# Patient Record
Sex: Female | Born: 1955 | Race: Black or African American | Hispanic: No | Marital: Married | Smoking: Never smoker
Health system: Southern US, Community
[De-identification: ages and names within clinical notes are randomized; demographics above are authoritative.]

## PROBLEM LIST (undated history)

## (undated) ENCOUNTER — Ambulatory Visit: Payer: Medicare HMO

## (undated) DIAGNOSIS — M1990 Unspecified osteoarthritis, unspecified site: Secondary | ICD-10-CM

## (undated) DIAGNOSIS — I499 Cardiac arrhythmia, unspecified: Secondary | ICD-10-CM

## (undated) DIAGNOSIS — I10 Essential (primary) hypertension: Secondary | ICD-10-CM

## (undated) DIAGNOSIS — H409 Unspecified glaucoma: Secondary | ICD-10-CM

## (undated) DIAGNOSIS — E119 Type 2 diabetes mellitus without complications: Secondary | ICD-10-CM

## (undated) HISTORY — PX: TONSILLECTOMY: SUR1361

## (undated) HISTORY — PX: ABDOMINAL HYSTERECTOMY: SHX81

## (undated) HISTORY — DX: Cardiac arrhythmia, unspecified: I49.9

---

## 2005-12-14 ENCOUNTER — Inpatient Hospital Stay: Payer: Self-pay | Admitting: Obstetrics and Gynecology

## 2012-05-08 DIAGNOSIS — I10 Essential (primary) hypertension: Secondary | ICD-10-CM | POA: Insufficient documentation

## 2012-05-08 DIAGNOSIS — E1159 Type 2 diabetes mellitus with other circulatory complications: Secondary | ICD-10-CM | POA: Insufficient documentation

## 2012-05-08 DIAGNOSIS — H409 Unspecified glaucoma: Secondary | ICD-10-CM | POA: Insufficient documentation

## 2012-05-08 DIAGNOSIS — E119 Type 2 diabetes mellitus without complications: Secondary | ICD-10-CM | POA: Insufficient documentation

## 2013-04-09 ENCOUNTER — Ambulatory Visit: Payer: Self-pay | Admitting: Family Medicine

## 2015-05-03 DIAGNOSIS — R809 Proteinuria, unspecified: Secondary | ICD-10-CM | POA: Insufficient documentation

## 2015-05-03 DIAGNOSIS — Z6835 Body mass index (BMI) 35.0-35.9, adult: Secondary | ICD-10-CM | POA: Insufficient documentation

## 2015-05-03 DIAGNOSIS — M1990 Unspecified osteoarthritis, unspecified site: Secondary | ICD-10-CM | POA: Insufficient documentation

## 2015-05-03 DIAGNOSIS — E669 Obesity, unspecified: Secondary | ICD-10-CM | POA: Insufficient documentation

## 2015-11-02 ENCOUNTER — Other Ambulatory Visit: Payer: Self-pay | Admitting: Family Medicine

## 2015-12-16 ENCOUNTER — Emergency Department: Payer: Self-pay

## 2015-12-16 ENCOUNTER — Encounter: Payer: Self-pay | Admitting: Emergency Medicine

## 2015-12-16 ENCOUNTER — Emergency Department
Admission: EM | Admit: 2015-12-16 | Discharge: 2015-12-16 | Disposition: A | Discharge status: Home / Self Care | Payer: Self-pay | Attending: Emergency Medicine | Admitting: Emergency Medicine

## 2015-12-16 DIAGNOSIS — W01198A Fall on same level from slipping, tripping and stumbling with subsequent striking against other object, initial encounter: Secondary | ICD-10-CM | POA: Insufficient documentation

## 2015-12-16 DIAGNOSIS — Y92480 Sidewalk as the place of occurrence of the external cause: Secondary | ICD-10-CM | POA: Insufficient documentation

## 2015-12-16 DIAGNOSIS — W19XXXA Unspecified fall, initial encounter: Secondary | ICD-10-CM

## 2015-12-16 DIAGNOSIS — S60416A Abrasion of right little finger, initial encounter: Secondary | ICD-10-CM | POA: Insufficient documentation

## 2015-12-16 DIAGNOSIS — R0789 Other chest pain: Secondary | ICD-10-CM | POA: Insufficient documentation

## 2015-12-16 DIAGNOSIS — E119 Type 2 diabetes mellitus without complications: Secondary | ICD-10-CM | POA: Insufficient documentation

## 2015-12-16 DIAGNOSIS — I10 Essential (primary) hypertension: Secondary | ICD-10-CM | POA: Insufficient documentation

## 2015-12-16 DIAGNOSIS — S0003XA Contusion of scalp, initial encounter: Secondary | ICD-10-CM

## 2015-12-16 DIAGNOSIS — Y9301 Activity, walking, marching and hiking: Secondary | ICD-10-CM | POA: Insufficient documentation

## 2015-12-16 DIAGNOSIS — Y999 Unspecified external cause status: Secondary | ICD-10-CM | POA: Insufficient documentation

## 2015-12-16 HISTORY — DX: Essential (primary) hypertension: I10

## 2015-12-16 HISTORY — DX: Type 2 diabetes mellitus without complications: E11.9

## 2015-12-16 MED ORDER — TRAMADOL HCL 50 MG PO TABS: 50.0000 mg | ORAL_TABLET | Freq: Four times a day (QID) | ORAL | 0 refills | Status: AC | PRN

## 2015-12-16 MED ORDER — BACITRACIN ZINC 500 UNIT/GM EX OINT
TOPICAL_OINTMENT | CUTANEOUS | Status: AC
  Filled 2015-12-16: qty 0.9

## 2015-12-16 MED ORDER — TRAMADOL HCL 50 MG PO TABS
50.0000 mg | ORAL_TABLET | Freq: Once | ORAL | Status: AC
  Administered 2015-12-16: 50 mg via ORAL
  Filled 2015-12-16: qty 1

## 2015-12-16 MED ORDER — DOUBLE ANTIBIOTIC 500-10000 UNIT/GM EX OINT
TOPICAL_OINTMENT | Freq: Once | CUTANEOUS | Status: AC
  Administered 2015-12-16: 23:00:00 via TOPICAL

## 2015-12-16 NOTE — ED Notes (Signed)
Pt presents to ED 17 after a slip and a fall at about 1830 this evening. Pt denies loss of consciousness, any loss of mental capability, loss or blurry vision, or nausea and vomiting. Pt does have a golf ball size hematoma above the right eye. Pt complains of pain to that area as well as to the ribs on the right side and the pinky finger on the right hand.

## 2015-12-16 NOTE — ED Notes (Signed)
Pt is in good condition, discharge instructions reviewed, follow up care and home care reviewed; prescription medication reviewed; pt verbalized understanding. Pt is ambulatory, but request a wheelchair to leave.  Pt is leaving with husband.

## 2015-12-16 NOTE — ED Notes (Signed)
Per MD McShane, pt does not require blood work or CT at this time. Pt given ice bag for head at this time. Pt explained risks of radiation and is understanding of MD's decision.

## 2015-12-16 NOTE — ED Notes (Signed)
Wrapped pt finger with bacitracin and guaze. Pt tolerated it well. Provided pt education regarding home care.

## 2015-12-16 NOTE — ED Provider Notes (Signed)
[facility] Emergency Department Provider Note   ____________________________________________   I have reviewed the triage vital signs and the nursing notes.   HISTORY  Chief Complaint Fall   History limited by: Not Limited   HPI Leah Sawyer is a 60 y.o. female who presents to the emergency department today after a fall. The patient states she was walking when she stepped off of a sidewalk onto a little dry. She did fall down onto her right side. The patient hit the right side of her forehead, right hand and right side of her chest. Primary pain is around her right forehead. She denies any change in vision double vision or pain with movement of her eye. She is not had any nausea or vomiting. She did not have any loss of consciousness. Eyes any shortness of breath. The patient denies any bloody cough. The patient denies any cervical spine.   Past Medical History:  Diagnosis Date  . Diabetes mellitus without complication (HCC)   . Hypertension     There are no active problems to display for this patient.   Past Surgical History:  Procedure Laterality Date  . ABDOMINAL HYSTERECTOMY    . TONSILLECTOMY      Prior to Admission medications   Not on File    Allergies Patient has no known allergies.  No family history on file.  Social History Social History  Substance Use Topics  . Smoking status: Never Smoker  . Smokeless tobacco: Never Used  . Alcohol use No    Review of Systems  Constitutional: Negative for fever. Cardiovascular: Positive for right lower chest pain. Respiratory: Negative for shortness of breath. Gastrointestinal: Negative for abdominal pain, vomiting and diarrhea. Musculoskeletal: Positive for left arm pain Neurological: Positive for headache.   10-point ROS otherwise negative.  ____________________________________________   PHYSICAL EXAM:  VITAL SIGNS: ED Triage Vitals  Enc Vitals Group     BP 12/16/15  1917 (!) 158/79     Pulse Rate 12/16/15 1917 93     Resp --      Temp 12/16/15 1917 98.6 F (37 C)     Temp Source 12/16/15 1917 Oral     SpO2 12/16/15 1917 98 %     Weight 12/16/15 1918 200 lb (90.7 kg)     Height 12/16/15 1918 5\' 2"  (1.575 m)     Head Circumference --      Peak Flow --      Pain Score 12/16/15 1918 7   Constitutional: Alert and oriented. Well appearing and in no distress. Eyes: Conjunctivae are normal. Normal extraocular movements. ENT   Head: Normocephalic. Hematoma with small overlying abrasion noted to right forehead. EOMI. No hemotympanum. No battle sign. No racoon eyes.   Nose: No congestion/rhinnorhea.   Mouth/Throat: Mucous membranes are moist.   Neck: No stridor. No midline tenderness. Hematological/Lymphatic/Immunilogical: No cervical lymphadenopathy. Cardiovascular: Normal rate, regular rhythm.  No murmurs, rubs, or gallops.  Respiratory: Normal respiratory effort without tachypnea nor retractions. Breath sounds are clear and equal bilaterally. No wheezes/rales/rhonchi. Gastrointestinal: Soft and nontender. No distention.  Genitourinary: Deferred Musculoskeletal: Normal range of motion in all extremities. Small abrasions to lateral side of right fifth digit, some tenderness to right fifth metacarpal. No spinal tenderness. Mild tenderness to palpation of right lower chest.  Neurologic:  Normal speech and language. No gross focal neurologic deficits are appreciated.  Skin:  Skin is warm, dry and intact. No rash noted. Psychiatric: Mood and affect are normal. Speech and  behavior are normal. Patient exhibits appropriate insight and judgment.  ____________________________________________    LABS (pertinent positives/negatives)  None  ____________________________________________   EKG  None  ____________________________________________    RADIOLOGY  Right  hand  IMPRESSION: Negative. ____________________________________________   PROCEDURES  Procedures  ____________________________________________   INITIAL IMPRESSION / ASSESSMENT AND PLAN / ED COURSE  Pertinent labs & imaging results that were available during my care of the patient were reviewed by me and considered in my medical decision making (see chart for details).  Patient presents to the emergency department today after mechanical fall primarily complaining of head ache, right hand pain and right chest wall pain. Patient did not have any loss of consciousness, no nausea or vomiting. Is alert and oriented. At this point do not think emergent neuro imaging is necessary given lack of concerning symptoms or signs. Will proceed with x-ray of the right hand given tenderness over the right fifth metacarpal. Additionally the patient had some mild tenderness to palpation of the right chest wall however was not significant and the patient did not have any shortness of breath. Do not think chest x-ray is warranted at this time.  Clinical Course    The patient's x-ray of the right hand was negative. Will discharge home. ____________________________________________   FINAL CLINICAL IMPRESSION(S) / ED DIAGNOSES  Final diagnoses:  Fall, initial encounter  Contusion of scalp, initial encounter     Note: This dictation was prepared with Nurse, children'sDragon dictation. Any transcriptional errors that result from this process are unintentional    Phineas SemenGraydon Azaleah Usman, MD 12/16/15 2255

## 2015-12-16 NOTE — ED Triage Notes (Signed)
Pt states that they were on [place] way [place] the [place] this [place] when pt slipped off of the sidewalk and hit her head and right hand. Pt denies LOC and is alert and oriented at his time on triage.

## 2015-12-16 NOTE — Discharge Instructions (Signed)
Please seek medical attention for any high fevers, chest pain, shortness of breath, change in behavior, persistent vomiting, bloody stool or any other new or concerning symptoms.  

## 2016-11-28 ENCOUNTER — Ambulatory Visit: Payer: Self-pay

## 2016-12-12 ENCOUNTER — Ambulatory Visit
Admission: RE | Admit: 2016-12-12 | Discharge: 2016-12-12 | Disposition: A | Discharge status: Home / Self Care | Payer: Self-pay | Source: Ambulatory Visit | Attending: Oncology | Admitting: Oncology

## 2016-12-12 ENCOUNTER — Ambulatory Visit: Payer: Self-pay | Attending: Oncology

## 2016-12-12 ENCOUNTER — Encounter (INDEPENDENT_AMBULATORY_CARE_PROVIDER_SITE_OTHER): Payer: Self-pay

## 2016-12-12 VITALS — BP 129/86 | HR 84 | Temp 97.3°F | Resp 18 | Ht 64.0 in | Wt 204.0 lb

## 2016-12-12 DIAGNOSIS — Z0000 Encounter for general adult medical examination without abnormal findings: Secondary | ICD-10-CM

## 2016-12-12 NOTE — Progress Notes (Addendum)
Subjective:     Patient ID: Leah FiedlerBonnie J Sawyer, female   DOB: 03/10/1955, 61 y.o.   MRN: [id number]  HPI   Review of Systems     Objective:   Physical Exam  Pulmonary/Chest: Right breast exhibits no inverted nipple, no mass, no nipple discharge, no skin change and no tenderness. Left breast exhibits no inverted nipple, no mass, no nipple discharge, no skin change and no tenderness. Breasts are symmetrical.    Cyst removal scar       Assessment:     61 year old patient presents for [facility] clinic visit.  Patient screened, and meets B[facility]CP eligibility.  Patient does not have insurance, Medicare or Medicaid.  Handout given on Affordable Care Act.  Instructed patient on breast self-exam using teach back method.  CBE unremarkable.  No mass or lump palpated. Pelvic exam normal.  History of hysterectomy for fibroids.   Patient taught piano lessons.    Plan:     Sent for bilateral screening mammogram.

## 2016-12-19 NOTE — Progress Notes (Signed)
Letter mailed from [facility] to notify of normal mammogram results.  Patient to return in one year for annual screening.  Copy to [facility]. 

## 2018-12-29 ENCOUNTER — Telehealth: Payer: Self-pay

## 2018-12-29 ENCOUNTER — Other Ambulatory Visit: Payer: Self-pay

## 2018-12-29 DIAGNOSIS — Z1211 Encounter for screening for malignant neoplasm of colon: Secondary | ICD-10-CM

## 2018-12-29 NOTE — Telephone Encounter (Signed)
Gastroenterology Pre-Procedure Review  Request Date: Thursday January 7th,2021 Requesting Physician: Dr. Allen Norris  PATIENT REVIEW QUESTIONS: The patient responded to the following health history questions as indicated:    1. Are you having any GI issues? no 2. Do you have a personal history of Polyps? yes (5 years ago) 3. Do you have a family history of Colon Cancer or Polyps? no 4. Diabetes Mellitus?yes oral meds 5. Joint replacements in the past 12 months?no 6. Major health problems in the past 3 months?no 7. Any artificial heart valves, MVP, or defibrillator?no    MEDICATIONS & ALLERGIES:    Patient reports the following regarding taking any anticoagulation/antiplatelet therapy:   Plavix, Coumadin, Eliquis, Xarelto, Lovenox, Pradaxa, Brilinta, or Effient? no Aspirin? yes (81mg  daily)  Patient confirms/reports the following medications:  No current outpatient medications on file.   No current facility-administered medications for this visit.    Patient confirms/reports the following allergies:  No Known Allergies  No orders of the defined types were placed in this encounter.   AUTHORIZATION INFORMATION Primary Insurance: 1D#: Group #:  Secondary Insurance: 1D#: Group #:  SCHEDULE INFORMATION: Date: Thursday 01/22/19 Time: Location:[facility]

## 2019-01-19 ENCOUNTER — Telehealth: Payer: Self-pay

## 2019-01-19 NOTE — Telephone Encounter (Signed)
Returned patients call to let her know the message was received in regard to canceling her colonoscopy with Dr. Servando Snare on 01/22/19.  She does not want to reschedule at this time.  Vikki in endo has been notified.  Referral noted.  Thanks [employer]

## 2019-01-20 ENCOUNTER — Other Ambulatory Visit: Payer: Self-pay

## 2019-01-22 ENCOUNTER — Ambulatory Visit: Admission: RE | Admit: 2019-01-22 | Payer: Self-pay | Source: Home / Self Care | Admitting: Gastroenterology

## 2019-01-22 ENCOUNTER — Encounter: Admission: RE | Payer: Self-pay | Source: Home / Self Care

## 2019-01-22 SURGERY — COLONOSCOPY WITH PROPOFOL
Anesthesia: General

## 2020-03-15 ENCOUNTER — Telehealth: Payer: Self-pay

## 2020-03-15 NOTE — Telephone Encounter (Signed)
Copied from CRM [id number]. Topic: Medical Record Request - Other >> Mar 15, 2020  3:17 PM Pawlus, Maxine Glenn A wrote: Patient Name/DOB/MRN #: Leah Sawyer / 1955/11/05 / [id number] Call Back #: [phone] Information Requested: Pt used to come to [facility] and wanted her entire medical record from when she was a patient. Does not have MyChart.

## 2020-05-24 ENCOUNTER — Ambulatory Visit
Admission: RE | Admit: 2020-05-24 | Discharge: 2020-05-24 | Disposition: A | Discharge status: Home / Self Care | Payer: Self-pay | Source: Ambulatory Visit | Attending: Oncology | Admitting: Oncology

## 2020-05-24 ENCOUNTER — Encounter: Payer: Self-pay | Admitting: *Deleted

## 2020-05-24 ENCOUNTER — Ambulatory Visit: Payer: Self-pay | Attending: Oncology | Admitting: *Deleted

## 2020-05-24 ENCOUNTER — Other Ambulatory Visit: Payer: Self-pay

## 2020-05-24 VITALS — BP 122/75 | HR 92 | Temp 97.5°F | Ht 64.0 in | Wt 196.7 lb

## 2020-05-24 DIAGNOSIS — Z0000 Encounter for general adult medical examination without abnormal findings: Secondary | ICD-10-CM

## 2020-05-24 NOTE — Progress Notes (Signed)
Letter mailed from [facility] to inform patient of her normal mammogram results.  Patient is to follow-up with annual screening in one year. 

## 2020-05-24 NOTE — Progress Notes (Signed)
  Subjective:     Patient ID: Leah Sawyer, female   DOB: 03-18-1955, 65 y.o.   MRN: [id number]  HPI   [facility] Medical History Record - 05/24/20 1103      Breast History   Screening cycle New    Provider (C[facility]) [facility]    Initial Mammogram 05/24/20    Last Mammogram Annual    Last Mammogram Date 12/12/16    Provider (Mammogram)  Delford Field    Recent Breast Symptoms None      Breast Cancer History   Breast Cancer History No personal or family history      Previous History of Breast Problems   Breast Surgery or Biopsy None    Breast Implants N/A    BSE Done Monthly      Gynecological/Obstetrical History   LMP --   hysterectomy 2007   Is there any chance that the client could be pregnant?  No    Age at menarche 48    Age at menopause 2007    Age at first live birth 34    Breast fed children Yes (type length in comments)   6 months   DES Exposure No    Cervical, Uterine or Ovarian cancer No    Family history of Cervial, Uterine or Ovarian cancer No    Hysterectomy Yes    Cervix removed Yes    Ovaries removed Yes    Laser/Cryosurgery No    Current method of birth control None    Current method of Estrogen/Hormone replacement None    Smoking history None            Review of Systems     Objective:   Physical Exam Chest:  Breasts:     Right: No swelling, bleeding, inverted nipple, mass, nipple discharge, skin change, tenderness, axillary adenopathy or supraclavicular adenopathy.     Left: No swelling, bleeding, inverted nipple, mass, nipple discharge, skin change, tenderness, axillary adenopathy or supraclavicular adenopathy.     Lymphadenopathy:     Upper Body:     Right upper body: No supraclavicular or axillary adenopathy.     Left upper body: No supraclavicular or axillary adenopathy.        Assessment:     65 year old female returns to [facility] for annual screening.  Clinical breast exam unremarkable.  Taught self breast awareness.  Patient has lost 60  lbs since the beginning of the pandemic.   States she has started eating healthier and exercising.  Patient has a history of hysterectomy for fibroids.  Pap smear omitted per [facility] protocol.  Patient has been screened for eligibility.  She does not have any insurance, Medicare or Medicaid.  She also meets financial eligibility.   Risk Assessment    Risk Scores      05/24/2020   Last edited by: Scarlett Presto, RN   5-year risk: 1.6 %   Lifetime risk: 6.3 %              Plan:     Screening mammogram ordered.  Will follow up per [facility] protocol.

## 2020-05-24 NOTE — Patient Instructions (Signed)
Gave patient hand-out, Women Staying Healthy, Active and Well from [facility], with education on breast health, pap smears, heart and colon health. 

## 2022-06-13 DIAGNOSIS — I700 Atherosclerosis of aorta: Secondary | ICD-10-CM | POA: Insufficient documentation

## 2022-06-15 ENCOUNTER — Other Ambulatory Visit: Payer: Self-pay | Admitting: Internal Medicine

## 2022-06-15 DIAGNOSIS — Z13820 Encounter for screening for osteoporosis: Secondary | ICD-10-CM

## 2022-06-15 DIAGNOSIS — Z1231 Encounter for screening mammogram for malignant neoplasm of breast: Secondary | ICD-10-CM

## 2022-08-08 ENCOUNTER — Ambulatory Visit
Admission: RE | Admit: 2022-08-08 | Discharge: 2022-08-08 | Discharge status: Home / Self Care | Payer: Medicare HMO | Source: Ambulatory Visit | Attending: Internal Medicine

## 2022-08-08 ENCOUNTER — Ambulatory Visit
Admission: RE | Admit: 2022-08-08 | Discharge: 2022-08-08 | Disposition: A | Discharge status: Home / Self Care | Payer: Medicare HMO | Source: Ambulatory Visit | Attending: Internal Medicine | Admitting: Internal Medicine

## 2022-08-08 DIAGNOSIS — E119 Type 2 diabetes mellitus without complications: Secondary | ICD-10-CM | POA: Insufficient documentation

## 2022-08-08 DIAGNOSIS — Z1231 Encounter for screening mammogram for malignant neoplasm of breast: Secondary | ICD-10-CM

## 2022-08-08 DIAGNOSIS — Z13820 Encounter for screening for osteoporosis: Secondary | ICD-10-CM | POA: Diagnosis present

## 2022-08-08 DIAGNOSIS — Z780 Asymptomatic menopausal state: Secondary | ICD-10-CM | POA: Insufficient documentation

## 2022-09-12 ENCOUNTER — Ambulatory Visit: Admission: RE | Admit: 2022-09-12 | Payer: Medicare HMO | Source: Ambulatory Visit

## 2022-09-12 ENCOUNTER — Other Ambulatory Visit: Payer: Self-pay | Admitting: Internal Medicine

## 2022-09-12 DIAGNOSIS — M25562 Pain in left knee: Secondary | ICD-10-CM

## 2022-10-29 DIAGNOSIS — M170 Bilateral primary osteoarthritis of knee: Secondary | ICD-10-CM | POA: Insufficient documentation

## 2022-12-07 DIAGNOSIS — B351 Tinea unguium: Secondary | ICD-10-CM | POA: Insufficient documentation

## 2022-12-20 IMAGING — MG MM DIGITAL SCREENING BILAT W/ TOMO AND CAD
8 series · 8 of 24 positions shown · non-contrast
Comparison: Previous exam(s).

CLINICAL DATA: Screening.

EXAM:
DIGITAL SCREENING BILATERAL MAMMOGRAM WITH TOMOSYNTHESIS AND CAD
TECHNIQUE: Bilateral screening digital craniocaudal and mediolateral oblique
mammograms were obtained. Bilateral screening digital breast
tomosynthesis was performed. The images were evaluated with
computer-aided detection.

[R MLO synth-2D]
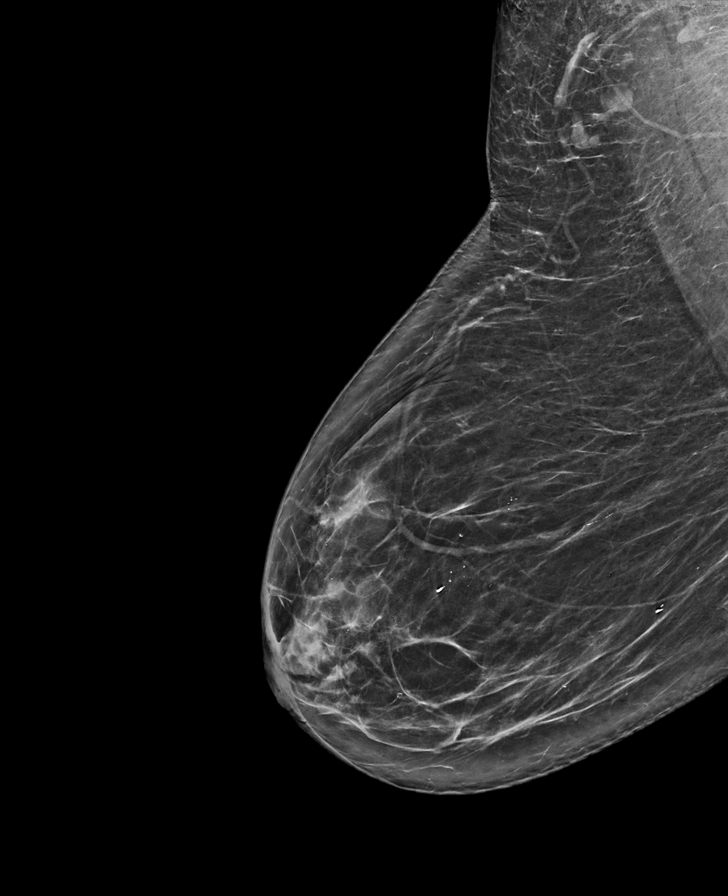

[R CC synth-2D]
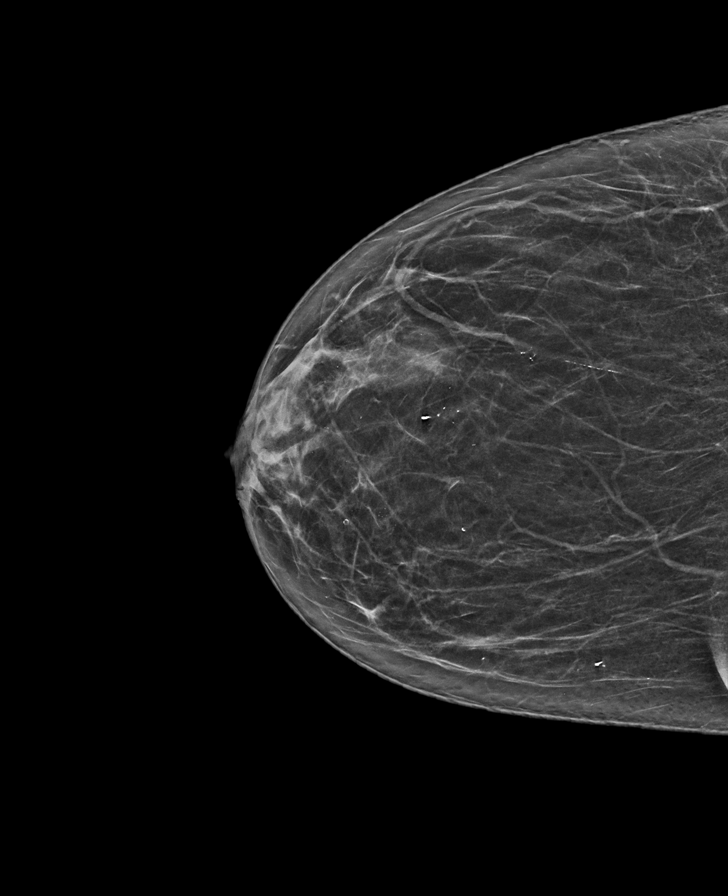

[L CC synth-2D]
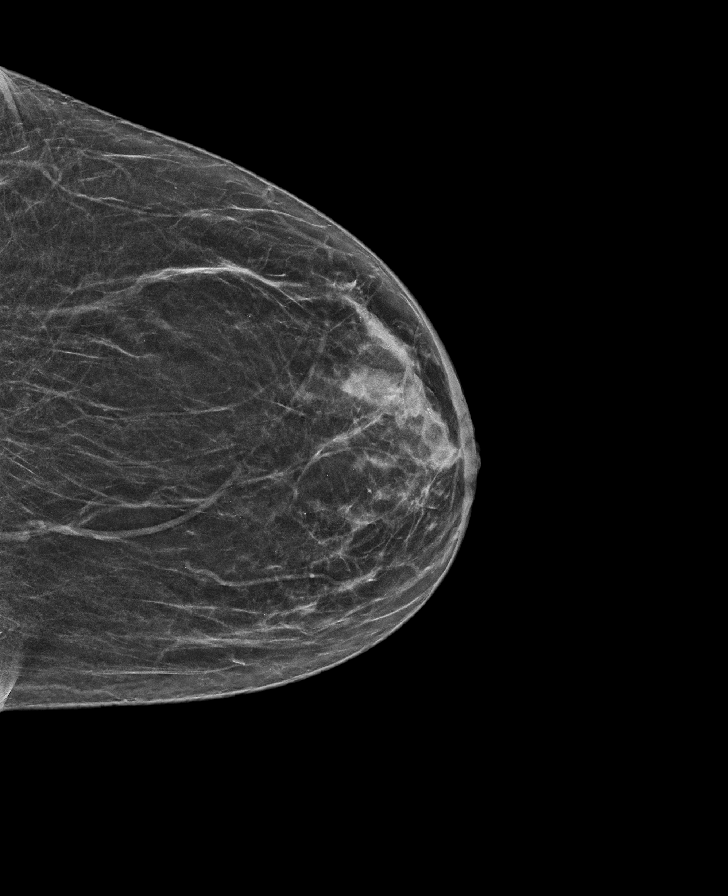

[L MLO synth-2D]
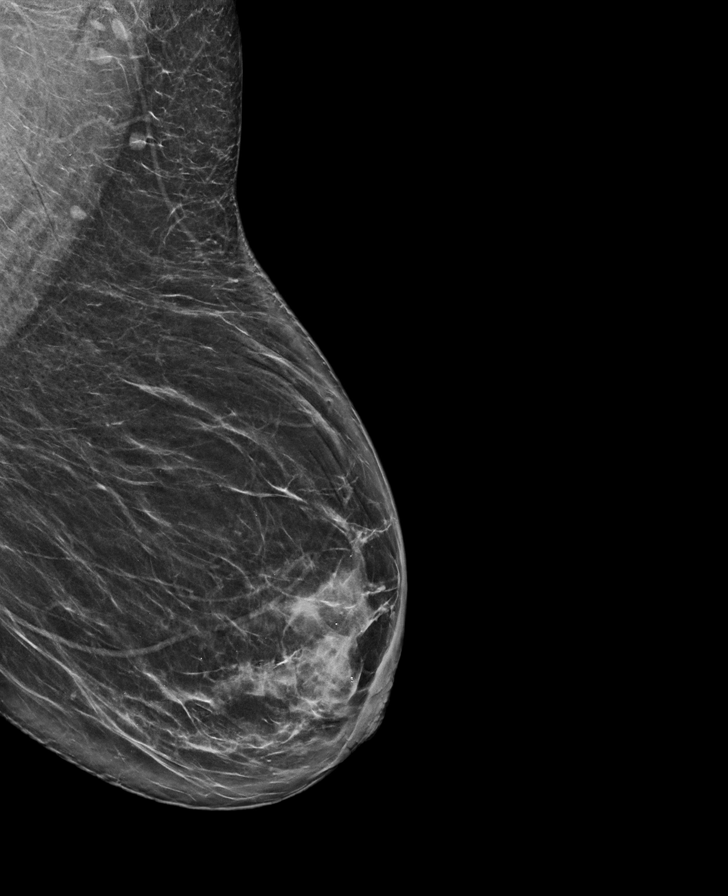

[L CC tomo · tomo slice 29/56.0]
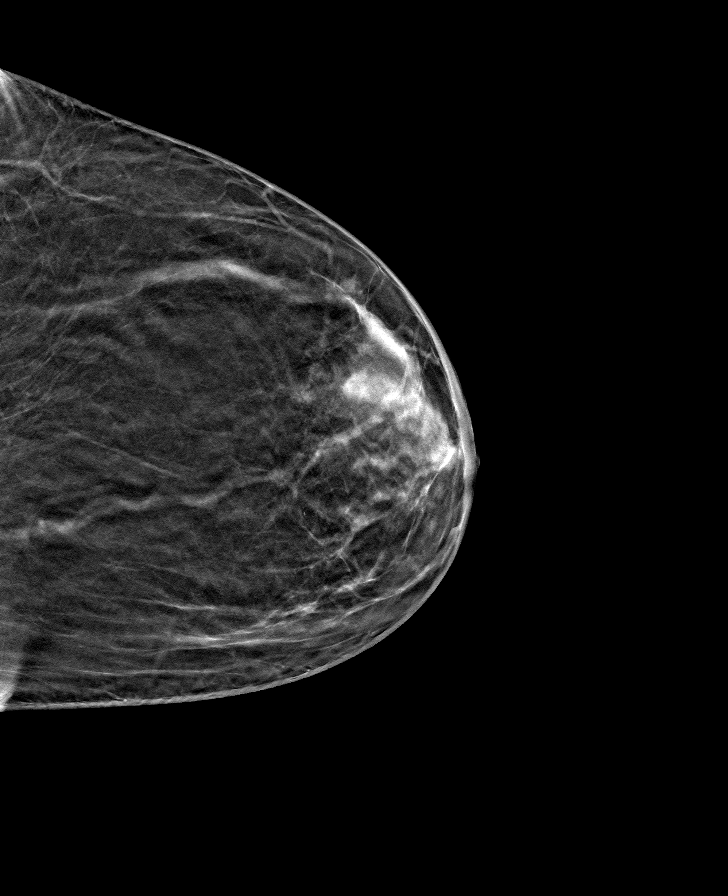

[L MLO tomo · tomo slice 38/75.0]
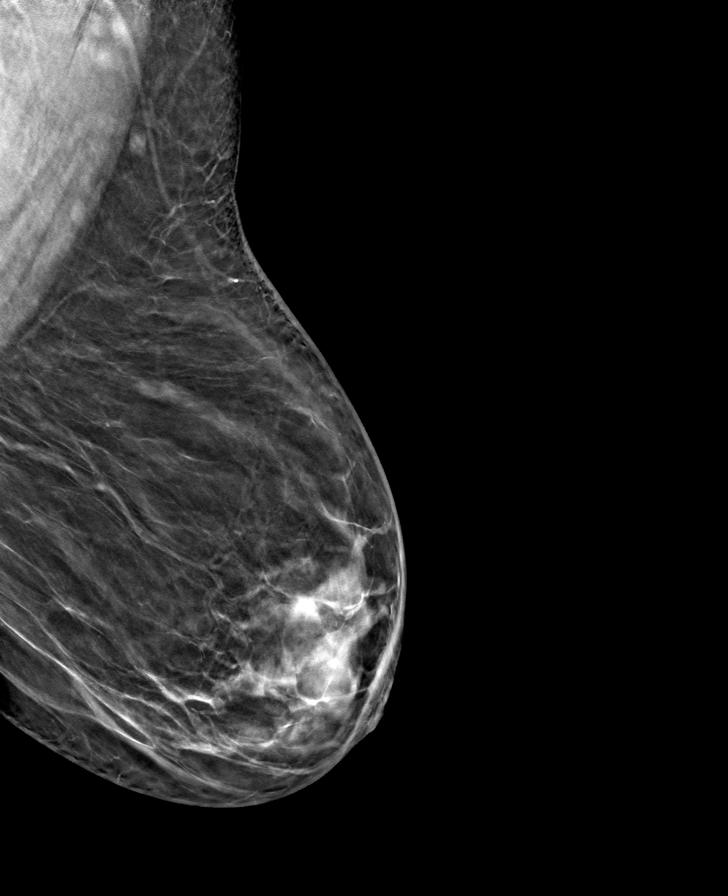

[R MLO tomo · tomo slice 37/74.0]
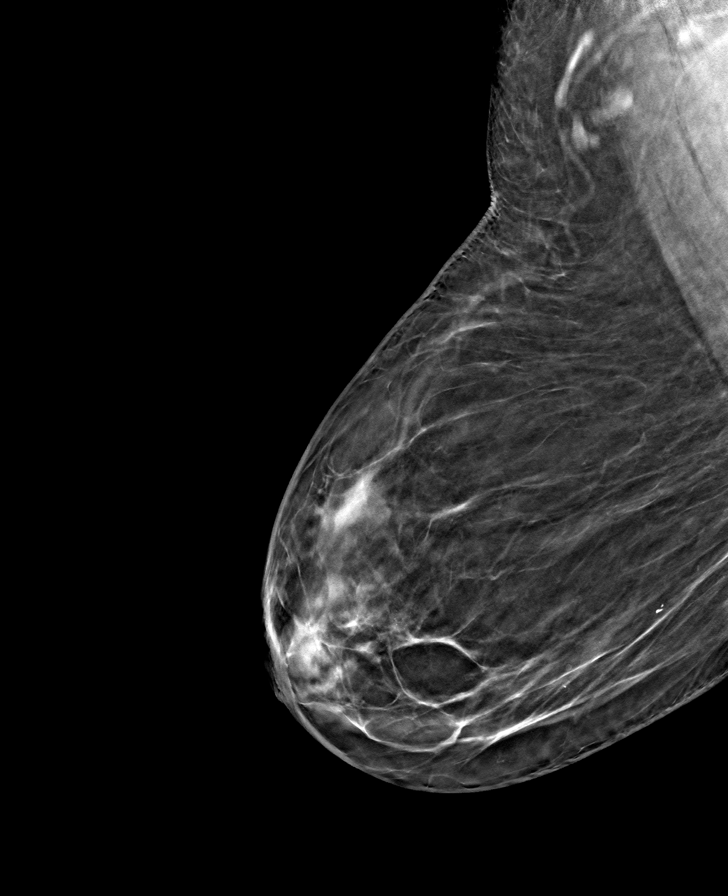

[R CC tomo · tomo slice 32/63.0]
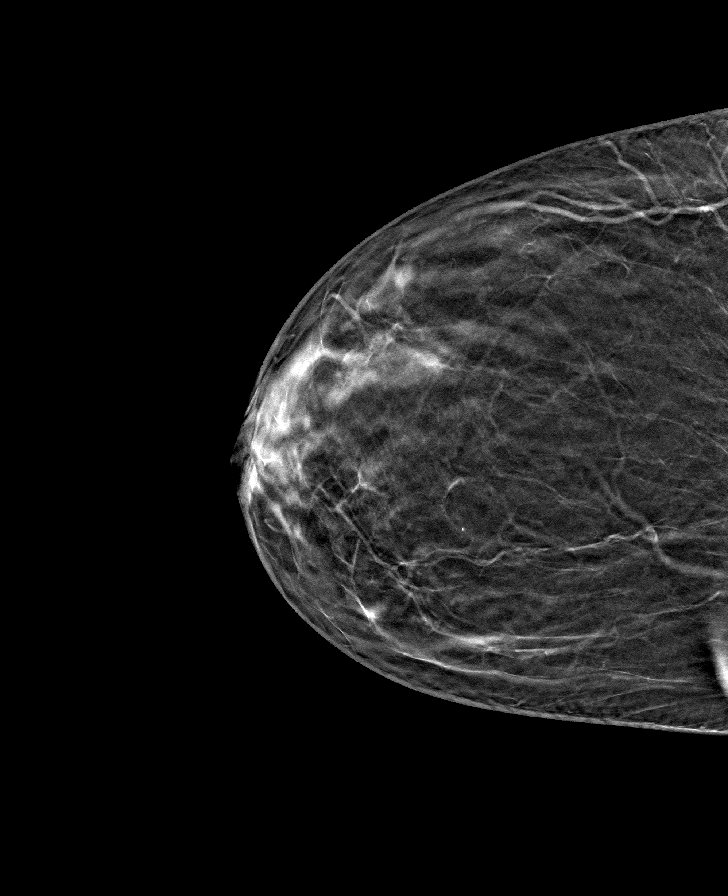

[8 of 24 positions shown; findings below may reference images not displayed]

ACR Breast Density Category b: There are scattered areas of
fibroglandular density.
FINDINGS: There are no findings suspicious for malignancy. The images were
evaluated with computer-aided detection.
IMPRESSION: No mammographic evidence of malignancy. A result letter of this
screening mammogram will be mailed directly to the patient.

RECOMMENDATION:
Screening mammogram in one year. (Code:[id number]-[id number])

BI-RADS CATEGORY  1: Negative.

## 2023-03-29 ENCOUNTER — Telehealth: Payer: Self-pay

## 2023-03-29 ENCOUNTER — Other Ambulatory Visit: Payer: Self-pay

## 2023-03-29 DIAGNOSIS — Z1211 Encounter for screening for malignant neoplasm of colon: Secondary | ICD-10-CM

## 2023-03-29 MED ORDER — PEG 3350-KCL-NA BICARB-NACL 420 G PO SOLR: 4000.0000 mL | Freq: Once | ORAL | 0 refills | Status: AC

## 2023-03-29 NOTE — Telephone Encounter (Signed)
 Gastroenterology Pre-Procedure Review  Request Date: 04/25/23 Requesting Physician: Dr. Servando Snare  PATIENT REVIEW QUESTIONS: The patient responded to the following health history questions as indicated:    1. Are you having any GI issues? no 2. Do you have a personal history of Polyps? no 3. Do you have a family history of Colon Cancer or Polyps? no 4. Diabetes Mellitus? yes (controlled with supplement berberine) 5. Joint replacements in the past 12 months?no 6. Major health problems in the past 3 months?no 7. Any artificial heart valves, MVP, or defibrillator?no    MEDICATIONS & ALLERGIES:    Patient reports the following regarding taking any anticoagulation/antiplatelet therapy:   Plavix, Coumadin, Eliquis, Xarelto, Lovenox, Pradaxa, Brilinta, or Effient? no Aspirin? no  Patient confirms/reports the following medications:  Current Outpatient Medications  Medication Sig Dispense Refill   ACCU-CHEK GUIDE TEST test strip USE TO CHECK FASTING BLOOD SUGAR ONCE DAILY AS DIRECTED AS NEEDED     Accu-Chek Softclix Lancets lancets daily as needed.     Blood Glucose Monitoring Suppl (ACCU-CHEK GUIDE ME) w/Device KIT USE AS DIRECTED TO CHECK BLOOD SUGAR FOR DIABETES     dorzolamide-timolol (COSOPT) 2-0.5 % ophthalmic solution 1 drop 2 (two) times daily.     erythromycin ophthalmic ointment Place into the right eye at bedtime.     meloxicam (MOBIC) 15 MG tablet Take 15 mg by mouth daily.     polyethylene glycol-electrolytes (NULYTELY) 420 g solution Take 4,000 mLs by mouth once for 1 dose. 4000 mL 0   amLODipine (NORVASC) 10 MG tablet Take 10 mg by mouth daily.     Travoprost, BAK Free, (TRAVATAN) 0.004 % SOLN ophthalmic solution 1 drop at bedtime.     No current facility-administered medications for this visit.    Patient confirms/reports the following allergies:  No Known Allergies  Orders Placed This Encounter  Procedures   Ambulatory referral to Gastroenterology    Referral Priority:    Routine    Referral Type:   Consultation    Referral Reason:   Specialty Services Required    Number of Visits Requested:   1    AUTHORIZATION INFORMATION Primary Insurance: 1D#: Group #:  Secondary Insurance: 1D#: Group #:  SCHEDULE INFORMATION: Date: 04/25/23 Time: Location: [facility]

## 2023-04-01 ENCOUNTER — Telehealth: Payer: Self-pay

## 2023-04-01 NOTE — Telephone Encounter (Signed)
 Called in error.  Colonoscopy already scheduled.  Thanks, [place], [place]

## 2023-04-12 ENCOUNTER — Telehealth: Payer: Self-pay

## 2023-04-12 NOTE — Telephone Encounter (Signed)
 Pt requesting call back to switch procedure to another date

## 2023-04-15 ENCOUNTER — Telehealth: Payer: Self-pay

## 2023-04-15 MED ORDER — NA SULFATE-K SULFATE-MG SULF 17.5-3.13-1.6 GM/177ML PO SOLN: 1.0000 | Freq: Once | ORAL | 0 refills | Status: AC

## 2023-04-15 NOTE — Telephone Encounter (Signed)
 Returned phone call to patient.  LVM to let her know that I don't have a Friday available and yes she can eat Bone Broth during her clear liquid and low fiber diet.  Asked her to call me back to discuss her reschedule request.  Thanks, Marcelino Duster, CMA

## 2023-04-15 NOTE — Telephone Encounter (Signed)
 Pt returned call back.  She requested rx to be sent to [facility] in [place].  She will keep colonoscopy as scheduled for 04/25/23 with Dr. Servando Snare.  Thanks, [place], [place]

## 2023-04-15 NOTE — Telephone Encounter (Signed)
 The patient called in asking if she can have bone broth. She also inquired if it would be possible to switch from Thursday to Friday.

## 2023-04-15 NOTE — Telephone Encounter (Signed)
 Returned phone call to patient to reschedule her 04/25/23 colonoscopy with Dr. Servando Snare at [facility].  LVM for her to call me back to schedule.  Thanks, [place], [place]

## 2023-04-15 NOTE — Addendum Note (Signed)
 Addended by: Avie Arenas on: 04/15/2023 11:00 AM   Modules accepted: Orders

## 2023-04-24 ENCOUNTER — Encounter: Payer: Self-pay | Admitting: Gastroenterology

## 2023-04-25 ENCOUNTER — Encounter
Admission: RE | Disposition: A | Discharge status: Home / Self Care | Payer: Self-pay | Source: Home / Self Care | Attending: Gastroenterology

## 2023-04-25 ENCOUNTER — Ambulatory Visit: Admitting: Certified Registered"

## 2023-04-25 ENCOUNTER — Other Ambulatory Visit: Payer: Self-pay

## 2023-04-25 ENCOUNTER — Encounter: Payer: Self-pay | Admitting: Gastroenterology

## 2023-04-25 ENCOUNTER — Ambulatory Visit
Admission: RE | Admit: 2023-04-25 | Discharge: 2023-04-25 | Disposition: A | Discharge status: Home / Self Care | Attending: Gastroenterology | Admitting: Gastroenterology

## 2023-04-25 DIAGNOSIS — I10 Essential (primary) hypertension: Secondary | ICD-10-CM | POA: Insufficient documentation

## 2023-04-25 DIAGNOSIS — E119 Type 2 diabetes mellitus without complications: Secondary | ICD-10-CM | POA: Insufficient documentation

## 2023-04-25 DIAGNOSIS — Z1211 Encounter for screening for malignant neoplasm of colon: Secondary | ICD-10-CM | POA: Diagnosis present

## 2023-04-25 DIAGNOSIS — K640 First degree hemorrhoids: Secondary | ICD-10-CM | POA: Insufficient documentation

## 2023-04-25 DIAGNOSIS — Z7984 Long term (current) use of oral hypoglycemic drugs: Secondary | ICD-10-CM | POA: Diagnosis not present

## 2023-04-25 HISTORY — DX: Unspecified glaucoma: H40.9

## 2023-04-25 HISTORY — PX: COLONOSCOPY: SHX5424

## 2023-04-25 HISTORY — DX: Unspecified osteoarthritis, unspecified site: M19.90

## 2023-04-25 LAB — GLUCOSE, CAPILLARY
Glucose-Capillary: 69 mg/dL — ABNORMAL LOW (ref 70–99)
Glucose-Capillary: 72 mg/dL (ref 70–99)

## 2023-04-25 SURGERY — COLONOSCOPY
Anesthesia: General

## 2023-04-25 MED ORDER — PROPOFOL 10 MG/ML IV BOLUS
INTRAVENOUS | Status: AC
  Filled 2023-04-25: qty 20

## 2023-04-25 MED ORDER — LIDOCAINE HCL (PF) 2 % IJ SOLN
INTRAMUSCULAR | Status: DC | PRN
  Administered 2023-04-25: 100 mg via INTRADERMAL

## 2023-04-25 MED ORDER — SODIUM CHLORIDE 0.9 % IV SOLN: INTRAVENOUS | Status: DC

## 2023-04-25 MED ORDER — PROPOFOL 500 MG/50ML IV EMUL
INTRAVENOUS | Status: DC | PRN
  Administered 2023-04-25: 100 mg via INTRAVENOUS
  Administered 2023-04-25: 130 ug/kg/min via INTRAVENOUS

## 2023-04-25 NOTE — Anesthesia Postprocedure Evaluation (Signed)
 Anesthesia Post Note  Patient: Leah Sawyer  Procedure(s) Performed: COLONOSCOPY  Patient location during evaluation: PACU Anesthesia Type: General Level of consciousness: awake Pain management: satisfactory to patient Vital Signs Assessment: post-procedure vital signs reviewed and stable Respiratory status: spontaneous breathing and nonlabored ventilation Cardiovascular status: stable Anesthetic complications: no   No notable events documented.   Last Vitals:  Vitals:   04/25/23 1140 04/25/23 1142  BP: 100/66 100/66  Pulse: 76 74  Resp: 16 (!) 22  Temp:    SpO2: 100% 100%    Last Pain:  Vitals:   04/25/23 1142  TempSrc:   PainSc: 0-No pain                 VAN STAVEREN,Yamilett Anastos

## 2023-04-25 NOTE — H&P (Signed)
 Midge Minium, MD [facility] [address], [address] [place], [place] [address] Phone: [phone] Fax : [phone]  Primary Care Physician:  Louis Matte, MD Primary Gastroenterologist:  Dr. Servando Snare  Pre-Procedure History & Physical: HPI:  Leah Sawyer is a 68 y.o. female is here for a screening colonoscopy.   Past Medical History:  Diagnosis Date   Diabetes mellitus without complication (HCC)    Glaucoma    Hypertension    Osteoarthritis     Past Surgical History:  Procedure Laterality Date   ABDOMINAL HYSTERECTOMY     TONSILLECTOMY      Prior to Admission medications   Medication Sig Start Date End Date Taking? Authorizing Provider  aspirin EC 81 MG tablet Take 81 mg by mouth daily. Swallow whole.   Yes [provider]  atorvastatin (LIPITOR) 10 MG tablet Take 10 mg by mouth daily.   Yes [provider]  losartan-hydrochlorothiazide (HYZAAR) 100-25 MG tablet Take 1 tablet by mouth daily.   Yes [provider]  metFORMIN (GLUCOPHAGE) 500 MG tablet Take by mouth 2 (two) times daily with a meal.   Yes [provider]  ACCU-CHEK GUIDE TEST test strip USE TO CHECK FASTING BLOOD SUGAR ONCE DAILY AS DIRECTED AS NEEDED 01/06/23   [provider]  Accu-Chek Softclix Lancets lancets daily as needed. 01/06/23   [provider]  amLODipine (NORVASC) 10 MG tablet Take 10 mg by mouth daily.    [provider]  Blood Glucose Monitoring Suppl (ACCU-CHEK GUIDE ME) w/Device KIT USE AS DIRECTED TO CHECK BLOOD SUGAR FOR DIABETES 01/06/23   [provider]  dorzolamide-timolol (COSOPT) 2-0.5 % ophthalmic solution 1 drop 2 (two) times daily. 10/03/22   [provider]  erythromycin ophthalmic ointment Place into the right eye at bedtime. 11/02/22   [provider]  meloxicam (MOBIC) 15 MG tablet Take 15 mg by mouth daily. 03/28/23   [provider]  Travoprost, BAK Free, (TRAVATAN) 0.004 % SOLN  ophthalmic solution 1 drop at bedtime.    [provider]    Allergies as of 03/29/2023   (No Known Allergies)    History reviewed. No pertinent family history.  Social History   Socioeconomic History   Marital status: Married    Spouse name: Not on file   Number of children: Not on file   Years of education: Not on file   Highest education level: Not on file  Occupational History   Not on file  Tobacco Use   Smoking status: Never   Smokeless tobacco: Never  Vaping Use   Vaping status: Not on file  Substance and Sexual Activity   Alcohol use: No   Drug use: No   Sexual activity: Not on file  Other Topics Concern   Not on file  Social History Narrative   Not on file   Social Drivers of Health   Financial Resource Strain: Low Risk  (12/17/2022)   Received from [facility]   Overall Financial Resource Strain (CARDIA)    Difficulty of Paying Living Expenses: Not very hard  Food Insecurity: No Food Insecurity (12/17/2022)   Received from [facility]   Hunger Vital Sign    Worried About Running Out of Food in the Last Year: Never true    Ran Out of Food in the Last Year: Never true  Transportation Needs: No Transportation Needs (12/17/2022)   Received from [facility]   [facility] - Transportation  In the past 12 months, has lack of transportation kept you from medical appointments or from getting medications?: No    Lack of Transportation (Non-Medical): No  Physical Activity: Not on file  Stress: Not on file  Social Connections: Not on file  Intimate Partner Violence: Not on file    Review of Systems: See HPI, otherwise negative ROS  Physical Exam: BP 134/71   Pulse 73   Temp (!) 97 F (36.1 C) (Tympanic)   Resp 18   Ht 5\' 3"  (1.6 m)   Wt 85.9 kg   SpO2 100%   BMI 33.55 kg/m  General:   Alert,  pleasant and cooperative in NAD Head:  Normocephalic and atraumatic. Neck:  Supple; no masses or  thyromegaly. Lungs:  Clear throughout to auscultation.    Heart:  Regular rate and rhythm. Abdomen:  Soft, nontender and nondistended. Normal bowel sounds, without guarding, and without rebound.   Neurologic:  Alert and  oriented x4;  grossly normal neurologically.  Impression/Plan: Leah Sawyer is now here to undergo a screening colonoscopy.  Risks, benefits, and alternatives regarding colonoscopy have been reviewed with the patient.  Questions have been answered.  All parties agreeable.

## 2023-04-25 NOTE — OR Nursing (Signed)
 Checked blood sugar prior colon procedure,  CBG 69 with no c/o and denies any issues; made Dr Mordecai Rasmussen aware, he evaluated pt at bedside; to check cbg post colonoscopy, pt in agreement

## 2023-04-25 NOTE — Anesthesia Preprocedure Evaluation (Signed)
 Anesthesia Evaluation  Patient identified by MRN, date of birth, ID band Patient awake    Reviewed: Allergy & Precautions, NPO status , Patient's Chart, lab work & pertinent test results  Airway Mallampati: II  TM Distance: >3 FB Neck ROM: full    Dental  (+) Teeth Intact   Pulmonary neg pulmonary ROS   Pulmonary exam normal breath sounds clear to auscultation       Cardiovascular Exercise Tolerance: Good hypertension, Pt. on medications negative cardio ROS Normal cardiovascular exam Rhythm:Regular Rate:Normal     Neuro/Psych negative neurological ROS  negative psych ROS   GI/Hepatic negative GI ROS, Neg liver ROS,,,  Endo/Other  negative endocrine ROSdiabetes, Type 2, Oral Hypoglycemic Agents    Renal/GU negative Renal ROS  negative genitourinary   Musculoskeletal  (+) Arthritis ,    Abdominal  (+) + obese  Peds negative pediatric ROS (+)  Hematology negative hematology ROS (+)   Anesthesia Other Findings Past Medical History: No date: Diabetes mellitus without complication (HCC) No date: Glaucoma No date: Hypertension No date: Osteoarthritis  Past Surgical History: No date: ABDOMINAL HYSTERECTOMY No date: TONSILLECTOMY     Reproductive/Obstetrics negative OB ROS                             Anesthesia Physical Anesthesia Plan  ASA: 2  Anesthesia Plan: General   Post-op Pain Management:    Induction: Intravenous  PONV Risk Score and Plan: Propofol infusion and TIVA  Airway Management Planned: Natural Airway and Nasal Cannula  Additional Equipment:   Intra-op Plan:   Post-operative Plan:   Informed Consent: I have reviewed the patients History and Physical, chart, labs and discussed the procedure including the risks, benefits and alternatives for the proposed anesthesia with the patient or authorized representative who has indicated his/her understanding and  acceptance.     Dental Advisory Given  Plan Discussed with: CRNA  Anesthesia Plan Comments:        Anesthesia Quick Evaluation

## 2023-04-25 NOTE — Op Note (Signed)
 [facility] Gastroenterology Patient Name: Leah Sawyer Procedure Date: 04/25/2023 11:04 AM MRN: [id number] Account #: [id number] Date of Birth: [birth year] Admit Type: Outpatient Age: 68 Room: [facility] ENDO ROOM 4 Gender: Female Note Status: Finalized Instrument Name: Prentice Docker [id number] Procedure:             Colonoscopy Indications:           Screening for colorectal malignant neoplasm Providers:             Midge Minium MD, MD Referring MD:          No Local Md, MD (Referring MD) Medicines:             Propofol per Anesthesia Complications:         No immediate complications. Procedure:             Pre-Anesthesia Assessment:                        - Prior to the procedure, a History and Physical was                         performed, and patient medications and allergies were                         reviewed. The patient's tolerance of previous                         anesthesia was also reviewed. The risks and benefits                         of the procedure and the sedation options and risks                         were discussed with the patient. All questions were                         answered, and informed consent was obtained. Prior                         Anticoagulants: The patient has taken no anticoagulant                         or antiplatelet agents. ASA Grade Assessment: II - A                         patient with mild systemic disease. After reviewing                         the risks and benefits, the patient was deemed in                         satisfactory condition to undergo the procedure.                        After obtaining informed consent, the colonoscope was                         passed under direct vision. Throughout the procedure,  the patient's blood pressure, pulse, and oxygen                         saturations were monitored continuously. The                         Colonoscope was introduced through the  anus and                         advanced to the the cecum, identified by appendiceal                         orifice and ileocecal valve. The colonoscopy was                         performed without difficulty. The patient tolerated                         the procedure well. The quality of the bowel                         preparation was excellent. Findings:      The perianal and digital rectal examinations were normal.      Non-bleeding internal hemorrhoids were found during retroflexion. The       hemorrhoids were Grade I (internal hemorrhoids that do not prolapse). Impression:            - Non-bleeding internal hemorrhoids.                        - No specimens collected. Recommendation:        - Discharge patient to home.                        - Resume previous diet.                        - Continue present medications.                        - Repeat colonoscopy in 10 years for screening                         purposes. Procedure Code(s):     --- Professional ---                        [phone], Colonoscopy, flexible; diagnostic, including                         collection of specimen(s) by brushing or washing, when                         performed (separate procedure) Diagnosis Code(s):     --- Professional ---                        Z12.11, Encounter for screening for malignant neoplasm                         of colon CPT copyright 2022 American Medical Association. All rights reserved. The  codes documented in this report are preliminary and upon coder review may  be revised to meet current compliance requirements. Midge Minium MD, MD 04/25/2023 11:26:27 AM This report has been signed electronically. Number of Addenda: 0 Note Initiated On: 04/25/2023 11:04 AM Scope Withdrawal Time: 0 hours 6 minutes 58 seconds  Total Procedure Duration: 0 hours 10 minutes 47 seconds  Estimated Blood Loss:  Estimated blood loss: none.      [facility]

## 2023-04-25 NOTE — Transfer of Care (Signed)
 Immediate Anesthesia Transfer of Care Note  Patient: Leah Sawyer  Procedure(s) Performed: COLONOSCOPY  Patient Location: PACU  Anesthesia Type:General  Level of Consciousness: awake, alert , and oriented  Airway & Oxygen Therapy: Patient Spontanous Breathing  Post-op Assessment: Report given to RN and Post -op Vital signs reviewed and stable  Post vital signs: stable  Last Vitals:  Vitals Value Taken Time  BP 100/56 04/25/23 1130  Temp 35.8 1130  Pulse 84 04/25/23 1130  Resp 17 04/25/23 1130  SpO2 100 % 04/25/23 1130  Vitals shown include unfiled device data.  Last Pain:  Vitals:   04/25/23 1042  TempSrc: Tympanic  PainSc: 0-No pain         Complications: No notable events documented.

## 2023-04-26 ENCOUNTER — Encounter: Payer: Self-pay | Admitting: Gastroenterology

## 2023-04-27 LAB — POCT ABI - SCREENING FOR PILOT NO CHARGE
Left ABI: 1.02
Right ABI: 0.94

## 2023-04-27 NOTE — Progress Notes (Unsigned)
 This pt attended 04/27/23 screening event. Pt did not indicate any SDOH needs.  Further f/u to be scheduled per health equity protocol.

## 2023-06-28 NOTE — Progress Notes (Signed)
 Pt attended 04/27/2023 screening event with left ABI of 1.02 and right ABI of 0.94. Pt noted at event that she does have a PCP. At event pt did not indicate any SDOH needs. Pt also noted that she is not a smoker but lives with someone who does and listed Medicare as her insurance at the event.   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 Pt has an upcoming appt with PCP on 07/08/2023 (is not a [facility] visible-visit). Pt does not indicate any SDOH needs at this time.  No additional pt f/u to be scheduled at this time per health equity protocol.

## 2023-10-02 DIAGNOSIS — M1712 Unilateral primary osteoarthritis, left knee: Secondary | ICD-10-CM | POA: Insufficient documentation

## 2023-10-30 ENCOUNTER — Ambulatory Visit: Attending: Cardiology | Admitting: Cardiology

## 2023-10-30 ENCOUNTER — Encounter: Payer: Self-pay | Admitting: Cardiology

## 2023-10-30 VITALS — BP 120/76 | HR 69 | Ht 63.0 in | Wt 204.9 lb

## 2023-10-30 DIAGNOSIS — I10 Essential (primary) hypertension: Secondary | ICD-10-CM | POA: Diagnosis not present

## 2023-10-30 DIAGNOSIS — I5020 Unspecified systolic (congestive) heart failure: Secondary | ICD-10-CM | POA: Diagnosis not present

## 2023-10-30 DIAGNOSIS — G4733 Obstructive sleep apnea (adult) (pediatric): Secondary | ICD-10-CM | POA: Diagnosis not present

## 2023-10-30 NOTE — Patient Instructions (Signed)
 Medication Instructions:  Your physician recommends that you continue on your current medications as directed. Please refer to the Current Medication list given to you today.   *If you need a refill on your cardiac medications before your next appointment, please call your pharmacy*  Lab Work: No labs ordered today  If you have labs (blood work) drawn today and your tests are completely normal, you will receive your results only by: MyChart Message (if you have MyChart) OR A paper copy in the mail If you have any lab test that is abnormal or we need to change your treatment, we will call you to review the results.  Testing/Procedures: Your physician has requested that you have an echocardiogram. Echocardiography is a painless test that uses sound waves to create images of your heart. It provides your doctor with information about the size and shape of your heart and how well your heart's chambers and valves are working.   You may receive an ultrasound enhancing agent through an IV if needed to better visualize your heart during the echo. This procedure takes approximately one hour.  There are no restrictions for this procedure.  This will take place at [address] [facility] [address]) [address], [place] [address]  Please note: We ask at that you not bring children with you during ultrasound (echo/ vascular) testing. Due to room size and safety concerns, children are not allowed in the ultrasound rooms during exams. Our front office staff cannot provide observation of children in our lobby area while testing is being conducted. An adult accompanying a patient to their appointment will only be allowed in the ultrasound room at the discretion of the ultrasound technician under special circumstances. We apologize for any inconvenience.   Follow-Up: At [facility], you and your health needs are our priority.  As part of our continuing mission to provide you with exceptional heart  care, our providers are all part of one team.  This team includes your primary Cardiologist (physician) and Advanced Practice Providers or APPs (Physician Assistants and Nurse Practitioners) who all work together to provide you with the care you need, when you need it.  Your next appointment:   3 month(s)  Provider:   You may see Dr. Darliss or one of the following Advanced Practice Providers on your designated Care Team:   Lonni Meager, NP Lesley Maffucci, PA-C Bernardino Bring, PA-C Cadence [place], PA-C Tylene Lunch, NP Barnie Hila, NP    We recommend signing up for the patient portal called MyChart.  Sign up information is provided on this After Visit Summary.  MyChart is used to connect with patients for Virtual Visits (Telemedicine).  Patients are able to view lab/test results, encounter notes, upcoming appointments, etc.  Non-urgent messages can be sent to your provider as well.   To learn more about what you can do with MyChart, go to [email or web address].

## 2023-10-30 NOTE — Progress Notes (Signed)
 Cardiology Office Note:    Date:  10/30/2023   ID:  Leah Sawyer, DOB Sep 08, 1955, MRN [id number]  PCP:  Sampson Ethridge LABOR, MD   [facility] Providers Cardiologist:  None     Referring MD: Sampson Ethridge LABOR,*   Chief Complaint  Patient presents with   Establish Care    New pt has been doing well with no complaints of chest pain, chest pressure or SOB, medciation reviewed verbally with patient    History of Present Illness:    Leah Sawyer is a 68 y.o. female with a hx of hypertension, diabetes, OSA presents due to systolic heart failure.  Patient had an echo 05/2022 with EF ?40 to 50%.  She denies chest pain, endorses shortness of breath, fatigue, daytime somnolence.  Not compliant with CPAP mask as prescribed.  Diagnosed with sleep apnea over 20 years ago.  Has not titrated her machine or seen a sleep specialist for long time.  Mother has a history of heart disease, patient is unsure which.  Past Medical History:  Diagnosis Date   Diabetes mellitus without complication (HCC)    Glaucoma    Hypertension    Osteoarthritis     Past Surgical History:  Procedure Laterality Date   ABDOMINAL HYSTERECTOMY     COLONOSCOPY N/A 04/25/2023   Procedure: COLONOSCOPY;  Surgeon: Jinny Carmine, MD;  Location: [facility] ENDOSCOPY;  Service: Endoscopy;  Laterality: N/A;  PATIENT REQUESTED AS CLOSE TO 11:00 AM AS POSSIBLE   TONSILLECTOMY      Current Medications: Current Meds  Medication Sig   ACCU-CHEK GUIDE TEST test strip USE TO CHECK FASTING BLOOD SUGAR ONCE DAILY AS DIRECTED AS NEEDED   Accu-Chek Softclix Lancets lancets daily as needed.   Blood Glucose Monitoring Suppl (ACCU-CHEK GUIDE ME) w/Device KIT USE AS DIRECTED TO CHECK BLOOD SUGAR FOR DIABETES   dorzolamide-timolol (COSOPT) 2-0.5 % ophthalmic solution 1 drop 2 (two) times daily.   losartan-hydrochlorothiazide (HYZAAR) 100-25 MG tablet Take 1 tablet by mouth daily.   meloxicam (MOBIC) 15 MG tablet Take 15 mg  by mouth daily.   Travoprost, BAK Free, (TRAVATAN) 0.004 % SOLN ophthalmic solution 1 drop at bedtime.     Allergies:   Patient has no known allergies.   Social History   Socioeconomic History   Marital status: Married    Spouse name: Not on file   Number of children: Not on file   Years of education: Not on file   Highest education level: Not on file  Occupational History   Not on file  Tobacco Use   Smoking status: Never   Smokeless tobacco: Never  Vaping Use   Vaping status: Not on file  Substance and Sexual Activity   Alcohol use: No   Drug use: No   Sexual activity: Yes  Other Topics Concern   Not on file  Social History Narrative   Not on file   Social Drivers of Health   Financial Resource Strain: Low Risk  (12/17/2022)   Received from [facility]   Overall Financial Resource Strain (CARDIA)    Difficulty of Paying Living Expenses: Not very hard  Food Insecurity: No Food Insecurity (04/27/2023)   Hunger Vital Sign    Worried About Running Out of Food in the Last Year: Never true    Ran Out of Food in the Last Year: Never true  Transportation Needs: No Transportation Needs (04/27/2023)   PRAPARE - Administrator, Civil Service (Medical):  No    Lack of Transportation (Non-Medical): No  Physical Activity: Not on file  Stress: Not on file  Social Connections: Not on file     Family History: The patient's family history includes Heart disease in her mother.  ROS:   Please see the history of present illness.     All other systems reviewed and are negative.  EKGs/Labs/Other Studies Reviewed:    The following studies were reviewed today:  EKG Interpretation Date/Time:  Wednesday October 30 2023 11:15:25 EDT Ventricular Rate:  69 PR Interval:  168 QRS Duration:  96 QT Interval:  414 QTC Calculation: 443 R Axis:   -26  Text Interpretation: Normal sinus rhythm Possible Left atrial enlargement Minimal voltage criteria for LVH,  may be normal variant ( R in aVL ) Confirmed by Darliss Rogue ([id number]) on 10/30/2023 11:19:11 AM    Recent Labs: No results found for requested labs within last 365 days.  Recent Lipid Panel No results found for: CHOL, TRIG, HDL, CHOLHDL, VLDL, LDLCALC, LDLDIRECT   Risk Assessment/Calculations:             Physical Exam:    VS:  BP 120/76 (BP Location: Left Arm, Patient Position: Sitting)   Pulse 69   Ht 5' 3 (1.6 m)   Wt 204 lb 14.4 oz (92.9 kg)   SpO2 97%   BMI 36.30 kg/m     Wt Readings from Last 3 Encounters:  10/30/23 204 lb 14.4 oz (92.9 kg)  04/25/23 189 lb 6.4 oz (85.9 kg)  05/24/20 196 lb 11.2 oz (89.2 kg)     GEN:  Well nourished, well developed in no acute distress HEENT: Normal NECK: No JVD; No carotid bruits CARDIAC: RRR, no murmurs, rubs, gallops RESPIRATORY:  Clear to auscultation without rales, wheezing or rhonchi  ABDOMEN: Soft, non-tender, non-distended MUSCULOSKELETAL:  No edema; No deformity  SKIN: Warm and dry NEUROLOGIC:  Alert and oriented x 3 PSYCHIATRIC:  Normal affect   ASSESSMENT:    1. Systolic heart failure, unspecified HF chronicity (HCC)   2. Primary hypertension   3. OSA (obstructive sleep apnea)    PLAN:    In order of problems listed above:  History of reduced EF.  Obtain echocardiogram to confirm ejection fraction.  Patient appears euvolemic.  Morbid obesity, untreated sleep apnea possibly contributing to shortness of breath symptoms. Hypertension, BP controlled.  Continue losartan-HCTZ 100-25 mg daily. OSA, noncompliant with CPAP, fatigue, somnolence.  Referred to sleep specialist for CPAP titration/update.  Follow-up after echo      Medication Adjustments/Labs and Tests Ordered: Current medicines are reviewed at length with the patient today.  Concerns regarding medicines are outlined above.  Orders Placed This Encounter  Procedures   Ambulatory referral to Pulmonology   EKG 12-Lead    ECHOCARDIOGRAM COMPLETE   No orders of the defined types were placed in this encounter.   Patient Instructions  Medication Instructions:  Your physician recommends that you continue on your current medications as directed. Please refer to the Current Medication list given to you today.   *If you need a refill on your cardiac medications before your next appointment, please call your pharmacy*  Lab Work: No labs ordered today  If you have labs (blood work) drawn today and your tests are completely normal, you will receive your results only by: MyChart Message (if you have MyChart) OR A paper copy in the mail If you have any lab test that is abnormal or we need to change your  treatment, we will call you to review the results.  Testing/Procedures: Your physician has requested that you have an echocardiogram. Echocardiography is a painless test that uses sound waves to create images of your heart. It provides your doctor with information about the size and shape of your heart and how well your heart's chambers and valves are working.   You may receive an ultrasound enhancing agent through an IV if needed to better visualize your heart during the echo. This procedure takes approximately one hour.  There are no restrictions for this procedure.  This will take place at [address] [facility] [address]) [address], [place] [address]  Please note: We ask at that you not bring children with you during ultrasound (echo/ vascular) testing. Due to room size and safety concerns, children are not allowed in the ultrasound rooms during exams. Our front office staff cannot provide observation of children in our lobby area while testing is being conducted. An adult accompanying a patient to their appointment will only be allowed in the ultrasound room at the discretion of the ultrasound technician under special circumstances. We apologize for any inconvenience.   Follow-Up: At [facility],  you and your health needs are our priority.  As part of our continuing mission to provide you with exceptional heart care, our providers are all part of one team.  This team includes your primary Cardiologist (physician) and Advanced Practice Providers or APPs (Physician Assistants and Nurse Practitioners) who all work together to provide you with the care you need, when you need it.  Your next appointment:   3 month(s)  Provider:   You may see Dr. Darliss or one of the following Advanced Practice Providers on your designated Care Team:   Lonni Meager, NP Lesley Maffucci, PA-C Bernardino Bring, PA-C Cadence [place], PA-C Tylene Lunch, NP Barnie Hila, NP    We recommend signing up for the patient portal called MyChart.  Sign up information is provided on this After Visit Summary.  MyChart is used to connect with patients for Virtual Visits (Telemedicine).  Patients are able to view lab/test results, encounter notes, upcoming appointments, etc.  Non-urgent messages can be sent to your provider as well.   To learn more about what you can do with MyChart, go to [email or web address].             Signed, Redell Darliss, MD  10/30/2023 12:41 PM    [facility]

## 2023-11-18 ENCOUNTER — Encounter: Payer: Self-pay | Admitting: Sleep Medicine

## 2023-11-18 ENCOUNTER — Ambulatory Visit (INDEPENDENT_AMBULATORY_CARE_PROVIDER_SITE_OTHER): Admitting: Sleep Medicine

## 2023-11-18 VITALS — BP 126/80 | HR 96 | Temp 98.8°F | Ht 63.0 in | Wt 206.4 lb

## 2023-11-18 DIAGNOSIS — E669 Obesity, unspecified: Secondary | ICD-10-CM | POA: Diagnosis not present

## 2023-11-18 DIAGNOSIS — I5020 Unspecified systolic (congestive) heart failure: Secondary | ICD-10-CM | POA: Insufficient documentation

## 2023-11-18 DIAGNOSIS — E559 Vitamin D deficiency, unspecified: Secondary | ICD-10-CM | POA: Insufficient documentation

## 2023-11-18 DIAGNOSIS — G4733 Obstructive sleep apnea (adult) (pediatric): Secondary | ICD-10-CM | POA: Diagnosis not present

## 2023-11-18 DIAGNOSIS — I10 Essential (primary) hypertension: Secondary | ICD-10-CM | POA: Diagnosis not present

## 2023-11-18 DIAGNOSIS — Z6836 Body mass index (BMI) 36.0-36.9, adult: Secondary | ICD-10-CM

## 2023-11-18 NOTE — Patient Instructions (Addendum)
 SABRA

## 2023-11-18 NOTE — Progress Notes (Signed)
 Name:Leah Sawyer MRN: [id number] DOB: 07-05-1955   CHIEF COMPLAINT:  EXCESSIVE DAYTIME SLEEPINESS   HISTORY OF PRESENT ILLNESS: Leah Sawyer is a 68 y.o. w/ a h/o DMII, OA, obesity and HTN who presents for c/o loud snoring and occasional daytime sleepiness which has been present for several years. Reports nocturnal awakenings due to nocturia, and occasionally has difficulty falling back to sleep. Denies any significant weight changes. Denies morning headaches, RLS symptoms, dream enactment, cataplexy, hypnagogic or hypnapompic hallucinations. Denies a family history of sleep apnea. Denies drowsy driving. Drinks sweet tea and coffee occasionally, occasional alcohol use, denies tobacco or illicit drug use.   Bedtime 11:30 pm-1 am Sleep onset varies Rise time 6:30-9 am   EPWORTH SLEEP SCORE 7    11/18/2023    1:00 PM  Results of the Epworth flowsheet  Sitting and reading 2  Watching TV 2  Sitting, inactive in a public place (e.g. a theatre or a meeting) 1  As a passenger in a car for an hour without a break 0  Lying down to rest in the afternoon when circumstances permit 2  Sitting and talking to someone 0  Sitting quietly after a lunch without alcohol 0  In a car, while stopped for a few minutes in traffic 0  Total score 7    PAST MEDICAL HISTORY :   has a past medical history of Arrhythmia, Diabetes mellitus without complication (HCC) (2003), Glaucoma, Hypertension (2003), Osteoarthritis, and Vitamin D deficiency (11/18/2023).  has a past surgical history that includes Abdominal hysterectomy; Tonsillectomy; and Colonoscopy (N/A, 04/25/2023). Prior to Admission medications   Medication Sig Start Date End Date Taking? Authorizing Provider  ACCU-CHEK GUIDE TEST test strip USE TO CHECK FASTING BLOOD SUGAR ONCE DAILY AS DIRECTED AS NEEDED 01/06/23  Yes [provider]  Accu-Chek Softclix Lancets lancets daily as needed. 01/06/23  Yes [provider]  Blood  Glucose Monitoring Suppl (ACCU-CHEK GUIDE ME) w/Device KIT USE AS DIRECTED TO CHECK BLOOD SUGAR FOR DIABETES 01/06/23  Yes [provider]  dorzolamide-timolol (COSOPT) 2-0.5 % ophthalmic solution 1 drop 2 (two) times daily. 10/03/22  Yes [provider]  Leah Sawyer 1000 MG CAPS as directed Orally once a day   Yes [provider]  losartan (COZAAR) 25 MG tablet Take 25 mg by mouth daily.   Yes [provider]  losartan-hydrochlorothiazide (HYZAAR) 100-25 MG tablet Take 1 tablet by mouth daily.   Yes [provider]  Lutein 6 MG TABS 1 capsule.   Yes [provider]  meloxicam (MOBIC) 15 MG tablet Take 15 mg by mouth daily. 03/28/23  Yes [provider]  Multiple Vitamin (MULTI VITAMIN) TABS 1 tablet Orally Once a day   Yes [provider]  Travoprost, BAK Free, (TRAVATAN) 0.004 % SOLN ophthalmic solution 1 drop at bedtime.   Yes [provider]   No Known Allergies  FAMILY HISTORY:  family history includes Heart disease in her mother. SOCIAL HISTORY:  reports that she has never smoked. She has never used smokeless tobacco. She reports that she does not drink alcohol and does not use drugs.   Review of Systems:  Gen:  Denies  fever, sweats, chills weight loss  HEENT: Denies blurred vision, double vision, ear pain, eye pain, hearing loss, nose bleeds, sore throat Cardiac:  No dizziness, chest pain or heaviness, chest tightness,edema, No JVD Resp:   No cough, -sputum production, -shortness of breath,-wheezing, -hemoptysis,  Gi: Denies swallowing  difficulty, stomach pain, nausea or vomiting, diarrhea, constipation, bowel incontinence Gu:  Denies bladder incontinence, burning urine Ext:   Denies Joint pain, stiffness or swelling Skin: Denies  skin rash, easy bruising or bleeding or hives Endoc:  Denies polyuria, polydipsia , polyphagia or weight change Psych:   Denies depression, insomnia or hallucinations  Other:   All other systems negative  VITAL SIGNS: BP 126/80   Pulse 96   Temp 98.8 F (37.1 C)   Ht 5' 3 (1.6 m)   Wt 206 lb 6.4 oz (93.6 kg)   SpO2 95%   BMI 36.56 kg/m    Physical Examination:   General Appearance: No distress  EYES PERRLA, EOM intact.   NECK Supple, No JVD Pulmonary: normal breath sounds, No wheezing.  CardiovascularNormal S1,S2.  No m/r/g.   Abdomen: Benign, Soft, non-tender. Skin:   warm, no rashes, no ecchymosis  Extremities: normal, no cyanosis, clubbing. Neuro:without focal findings,  speech normal  PSYCHIATRIC: Mood, affect within normal limits.   ASSESSMENT AND PLAN  OSA I suspect that OSA is likely present due to clinical presentation. Discussed the consequences of untreated sleep apnea. Advised not to drive drowsy for safety of patient and others. Will complete further evaluation with a home sleep study and follow up to review results.    HTN Stable, on current management. Following with PCP.   Obesity Counseled patient on diet and lifestyle modification.    MEDICATION ADJUSTMENTS/LABS AND TESTS ORDERED: Recommend Sleep Study   Patient  satisfied with Plan of action and management. All questions answered  Follow up to review HST results and treatment plan.   I spent a total of 30 minutes reviewing chart data, face-to-face evaluation with the patient, counseling and coordination of care as detailed above.    Leah Sawyer, M.D.  Sleep Medicine [facility]

## 2023-12-02 ENCOUNTER — Encounter

## 2023-12-02 DIAGNOSIS — G4733 Obstructive sleep apnea (adult) (pediatric): Secondary | ICD-10-CM

## 2023-12-16 ENCOUNTER — Ambulatory Visit

## 2023-12-16 DIAGNOSIS — R0683 Snoring: Secondary | ICD-10-CM | POA: Diagnosis not present

## 2023-12-16 DIAGNOSIS — G4733 Obstructive sleep apnea (adult) (pediatric): Secondary | ICD-10-CM | POA: Diagnosis not present

## 2023-12-17 ENCOUNTER — Ambulatory Visit: Payer: Self-pay

## 2023-12-17 DIAGNOSIS — G4733 Obstructive sleep apnea (adult) (pediatric): Secondary | ICD-10-CM

## 2023-12-20 NOTE — Telephone Encounter (Signed)
 I have spoke with the patient and placed the order for the CPAP machine. She will call back once she has received the machine to schedule her CPAP compliance appt.   Nothing further needed.

## 2023-12-20 NOTE — Telephone Encounter (Signed)
-----   Message from [facility] D REDDY sent at 12/17/2023 11:20 AM EST ----- Please notify patient that HST revealed severe OSA, recommend proceeding with APAP therapy set to 4-16 cm H2O, EPR 3 with the Airtouch N30i nasal mask. Please also schedule a 3 month follow up visit  if patient wishes to proceed with CPAP therapy. Thanks    ----- Message ----- From: Vannie Donzell RAMAN Sent: 12/17/2023   7:25 AM EST To: Pallavi D Reddy, MD

## 2024-01-02 ENCOUNTER — Ambulatory Visit

## 2024-01-02 DIAGNOSIS — I10 Essential (primary) hypertension: Secondary | ICD-10-CM | POA: Diagnosis not present

## 2024-01-02 DIAGNOSIS — G4733 Obstructive sleep apnea (adult) (pediatric): Secondary | ICD-10-CM

## 2024-01-02 DIAGNOSIS — I5020 Unspecified systolic (congestive) heart failure: Secondary | ICD-10-CM

## 2024-01-02 LAB — ECHOCARDIOGRAM COMPLETE
AR max vel: 1.69 cm2
AV Area VTI: 1.96 cm2
AV Area mean vel: 1.67 cm2
AV Mean grad: 4 mmHg
AV Peak grad: 6.8 mmHg
Ao pk vel: 1.3 m/s
Area-P 1/2: 4.49 cm2
S' Lateral: 3 cm

## 2024-01-07 ENCOUNTER — Ambulatory Visit: Payer: Self-pay | Admitting: Cardiology

## 2024-01-08 ENCOUNTER — Other Ambulatory Visit: Payer: Self-pay

## 2024-01-08 MED ORDER — CARVEDILOL 6.25 MG PO TABS: 6.2500 mg | ORAL_TABLET | Freq: Two times a day (BID) | ORAL | 3 refills | Status: AC

## 2024-01-08 MED ORDER — SACUBITRIL-VALSARTAN 49-51 MG PO TABS: 1.0000 | ORAL_TABLET | Freq: Two times a day (BID) | ORAL | 3 refills | Status: AC

## 2024-01-30 ENCOUNTER — Encounter: Payer: Self-pay | Admitting: Cardiology

## 2024-01-30 ENCOUNTER — Ambulatory Visit: Attending: Cardiology | Admitting: Cardiology

## 2024-01-30 VITALS — BP 118/78 | HR 65 | Ht 63.0 in | Wt 213.6 lb

## 2024-01-30 DIAGNOSIS — I5020 Unspecified systolic (congestive) heart failure: Secondary | ICD-10-CM | POA: Diagnosis not present

## 2024-01-30 DIAGNOSIS — G4733 Obstructive sleep apnea (adult) (pediatric): Secondary | ICD-10-CM

## 2024-01-30 DIAGNOSIS — I10 Essential (primary) hypertension: Secondary | ICD-10-CM

## 2024-01-30 MED ORDER — SPIRONOLACTONE 25 MG PO TABS: 12.5000 mg | ORAL_TABLET | Freq: Every day | ORAL | 3 refills | Status: AC

## 2024-01-30 NOTE — Patient Instructions (Signed)
 Medication Instructions:  - START spironolactone  12.5 mg daily   *If you need a refill on your cardiac medications before your next appointment, please call your pharmacy*  Lab Work: Your provider would like for you to have following labs drawn today CBC, BMP.   If you have labs (blood work) drawn today and your tests are completely normal, you will receive your results only by: MyChart Message (if you have MyChart) OR A paper copy in the mail If you have any lab test that is abnormal or we need to change your treatment, we will call you to review the results.  Testing/Procedures:  [place] OF [place] [place] [address] [place] [address] Dept: [phone] Loc: [phone]  Leah Sawyer  01/30/2024  You are scheduled for a Cardiac Catheterization on Friday, February 6 with Dr. Lonni End.  1. Please arrive at the [facility] Entrance of [facility], [address], [place] [address] at 7:30 AM (This is 1 hour(s) prior to your procedure time).  Proceed to the Check-In Desk directly inside the entrance.  Procedure Parking: Use the entrance off of the [address] side of the hospital. Turn right upon entering and follow the driveway to parking that is directly in front of the Heart & Vascular Center. There is no valet parking available at this entrance, however there is an awning directly in front of the Heart & Vascular Center for drop off/ pick up for patients.  Special note: Every effort is made to have your procedure done on time. Please understand that emergencies sometimes delay scheduled procedures.  2. Diet: Nothing to eat after midnight.   3. Hydration: You need to be well hydrated before your procedure. On February 6, you may drink approved liquids (see below) until 2 hours before the procedure, with 16 oz of water as your last intake.   List of  approved liquids water, clear juice, clear tea, black coffee, fruit juices, non-citric and without pulp, carbonated beverages, Gatorade, Kool -Aid, plain Jello-O and plain ice popsicles.   On the morning of your procedure, take your Aspirin 81 mg and any morning medicines NOT listed above.  You may use sips of water.  - HOLD spironolactone  day of procedure   6. Plan to go home the same day, you will only stay overnight if medically necessary. 7. Bring a current list of your medications and current insurance cards. 8. You MUST have a responsible person to drive you home. 9. Someone MUST be with you the first 24 hours after you arrive home or your discharge will be delayed. 10. Please wear clothes that are easy to get on and off and wear slip-on shoes.  Thank you for allowing us  to care for you!   --  Invasive Cardiovascular services   Follow-Up: At [facility], you and your health needs are our priority.  As part of our continuing mission to provide you with exceptional heart care, our providers are all part of one team.  This team includes your primary Cardiologist (physician) and Advanced Practice Providers or APPs (Physician Assistants and Nurse Practitioners) who all work together to provide you with the care you need, when you need it.  Your next appointment:   6 week(s)  Provider:   You may see Dr Darliss or one of the following Advanced Practice Providers on your designated Care Team:   Lonni  Vivienne, NP Lesley Maffucci, PA-C Bernardino Bring, PA-C Cadence Furth, PA-C Tylene Lunch, NP Barnie Hila, NP    We recommend signing up for the patient portal called MyChart.  Sign up information is provided on this After Visit Summary.  MyChart is used to connect with patients for Virtual Visits (Telemedicine).  Patients are able to view lab/test results, encounter notes, upcoming appointments, etc.  Non-urgent messages can be sent to your provider as well.    To learn more about what you can do with MyChart, go to [email or web address].

## 2024-01-30 NOTE — H&P (View-Only) (Signed)
 " Cardiology Office Note:    Date:  01/30/2024   ID:  Leah Sawyer, DOB 1955/11/04, MRN [id number]  PCP:  Sampson Ethridge LABOR, MD   [facility] Providers Cardiologist:  None     Referring MD: Sampson Ethridge LABOR, MD   Chief Complaint  Patient presents with   Follow-up    3 month follow up  / pt has been doing well with no complaints of chest pain, chest pressure or SOB, medication reviewed verbally with patient .     History of Present Illness:    Leah Sawyer is a 69 y.o. female with a hx of HFrEF, hypertension, diabetes, OSA presents due to systolic heart failure.  Last seen due to OSA, and history of CHF.  Echocardiogram was obtained showing moderately reduced ejection fraction EF 35 to 40%.  Medication was switched, started on Entresto  and carvedilol .  Patient tolerating both meds with no issues.  Denies chest pain, denies edema.  States her mother has a history of cardiomyopathy.  Also endorses daytime fatigue and somnolence.  Was referred and followed up with sleep medicine. CPAP mask prescribed/calibrated.  Echo 12/25 EF 35 to 40%. Patient had an echo 05/2022 with EF ?40 to 50%.    Past Medical History:  Diagnosis Date   Arrhythmia    Diabetes mellitus without complication (HCC) 2003   Glaucoma    Hypertension 2003   Osteoarthritis    Vitamin D deficiency 11/18/2023    Past Surgical History:  Procedure Laterality Date   ABDOMINAL HYSTERECTOMY     COLONOSCOPY N/A 04/25/2023   Procedure: COLONOSCOPY;  Surgeon: Jinny Carmine, MD;  Location: [facility] ENDOSCOPY;  Service: Endoscopy;  Laterality: N/A;  PATIENT REQUESTED AS CLOSE TO 11:00 AM AS POSSIBLE   TONSILLECTOMY      Current Medications: Current Meds  Medication Sig   ACCU-CHEK GUIDE TEST test strip USE TO CHECK FASTING BLOOD SUGAR ONCE DAILY AS DIRECTED AS NEEDED   Accu-Chek Softclix Lancets lancets daily as needed.   Blood Glucose Monitoring Suppl (ACCU-CHEK GUIDE ME) w/Device KIT USE AS DIRECTED  TO CHECK BLOOD SUGAR FOR DIABETES   carvedilol  (COREG ) 6.25 MG tablet Take 1 tablet (6.25 mg total) by mouth 2 (two) times daily.   dorzolamide-timolol (COSOPT) 2-0.5 % ophthalmic solution 1 drop 2 (two) times daily.   Krill Oil 1000 MG CAPS as directed Orally once a day   meloxicam (MOBIC) 15 MG tablet Take 15 mg by mouth daily.   sacubitril -valsartan  (ENTRESTO ) 49-51 MG Take 1 tablet by mouth 2 (two) times daily.   spironolactone  (ALDACTONE ) 25 MG tablet Take 0.5 tablets (12.5 mg total) by mouth daily.   Travoprost, BAK Free, (TRAVATAN) 0.004 % SOLN ophthalmic solution 1 drop at bedtime.     Allergies:   Patient has no known allergies.   Social History   Socioeconomic History   Marital status: Married    Spouse name: Not on file   Number of children: Not on file   Years of education: Not on file   Highest education level: Not on file  Occupational History   Not on file  Tobacco Use   Smoking status: Never   Smokeless tobacco: Never  Vaping Use   Vaping status: Not on file  Substance and Sexual Activity   Alcohol use: No   Drug use: No   Sexual activity: Yes  Other Topics Concern   Not on file  Social History Narrative   Not on file   Social Drivers  of Health   Tobacco Use: Low Risk (01/30/2024)   Patient History    Smoking Tobacco Use: Never    Smokeless Tobacco Use: Never    Passive Exposure: Not on file  Financial Resource Strain: Low Risk  (12/17/2022)   Received from [facility]   Overall Financial Resource Strain (CARDIA)    Difficulty of Paying Living Expenses: Not very hard  Food Insecurity: No Food Insecurity (04/27/2023)   Hunger Vital Sign    Worried About Running Out of Food in the Last Year: Never true    Ran Out of Food in the Last Year: Never true  Transportation Needs: No Transportation Needs (04/27/2023)   PRAPARE - Administrator, Civil Service (Medical): No    Lack of Transportation (Non-Medical): No  Physical  Activity: Not on file  Stress: Not on file  Social Connections: Not on file  Depression (EYV7-0): Not on file  Alcohol Screen: Not on file  Housing: Unknown (04/27/2023)   Housing Stability Vital Sign    Unable to Pay for Housing in the Last Year: No    Number of Times Moved in the Last Year: Not on file    Homeless in the Last Year: No  Utilities: Not At Risk (04/27/2023)   AHC Utilities    Threatened with loss of utilities: No  Health Literacy: Not on file     Family History: The patient's family history includes Heart disease in her mother.  ROS:   Please see the history of present illness.     All other systems reviewed and are negative.  EKGs/Labs/Other Studies Reviewed:    The following studies were reviewed today:       Recent Labs: No results found for requested labs within last 365 days.  Recent Lipid Panel No results found for: CHOL, TRIG, HDL, CHOLHDL, VLDL, LDLCALC, LDLDIRECT   Risk Assessment/Calculations:             Physical Exam:    VS:  BP 118/78 (BP Location: Left Arm, Patient Position: Sitting, Cuff Size: Normal)   Pulse 65   Ht 5' 3 (1.6 m)   Wt 213 lb 9.6 oz (96.9 kg)   SpO2 97%   BMI 37.84 kg/m     Wt Readings from Last 3 Encounters:  01/30/24 213 lb 9.6 oz (96.9 kg)  11/18/23 206 lb 6.4 oz (93.6 kg)  10/30/23 204 lb 14.4 oz (92.9 kg)     GEN:  Well nourished, well developed in no acute distress HEENT: Normal NECK: No JVD; No carotid bruits CARDIAC: RRR, no murmurs, rubs, gallops RESPIRATORY:  Clear to auscultation without rales, wheezing or rhonchi  ABDOMEN: Soft, non-tender, non-distended MUSCULOSKELETAL:  No edema; No deformity  SKIN: Warm and dry NEUROLOGIC:  Alert and oriented x 3 PSYCHIATRIC:  Normal affect   ASSESSMENT:    1. Systolic heart failure, unspecified HF chronicity (HCC)   2. Primary hypertension   3. OSA (obstructive sleep apnea)    PLAN:    In order of problems listed  above:  Cardiomyopathy, echo 12/25 shows moderately reduced EF 35 to 40%.  Etiology for cardiomyopathy could be hereditary with mother having history of cardiomyopathy.  Continue Coreg  6.25 mg twice daily, Entresto  49-51 mg twice daily started.  Schedule right and left heart cath.  Start Aldactone  12.5 mg daily.   Hypertension, BP controlled.  Coreg  6.25 mg twice daily, Entresto  49-51 mg twice daily, Aldactone  12.5 mg daily as above. OSA, noncompliant with  CPAP, fatigue, somnolence.  Referred to sleep specialist for CPAP titration/update.  Follow-up in 6 weeks after cardiac cath.  Informed Consent   Shared Decision Making/Informed Consent The risks [stroke (1 in 1000), death (1 in 1000), kidney failure [usually temporary] (1 in 500), bleeding (1 in 200), allergic reaction [possibly serious] (1 in 200)], benefits (diagnostic support and management of coronary artery disease) and alternatives of a cardiac catheterization were discussed in detail with Leah Sawyer and she is willing to proceed.          Medication Adjustments/Labs and Tests Ordered: Current medicines are reviewed at length with the patient today.  Concerns regarding medicines are outlined above.  Orders Placed This Encounter  Procedures   CBC   Basic metabolic panel with GFR   EKG 87-Ozji   Meds ordered this encounter  Medications   spironolactone  (ALDACTONE ) 25 MG tablet    Sig: Take 0.5 tablets (12.5 mg total) by mouth daily.    Dispense:  90 tablet    Refill:  3    Patient Instructions  Medication Instructions:  - START spironolactone  12.5 mg daily   *If you need a refill on your cardiac medications before your next appointment, please call your pharmacy*  Lab Work: Your provider would like for you to have following labs drawn today CBC, BMP.   If you have labs (blood work) drawn today and your tests are completely normal, you will receive your results only by: MyChart Message (if you have MyChart) OR A paper  copy in the mail If you have any lab test that is abnormal or we need to change your treatment, we will call you to review the results.  Testing/Procedures:  [place] OF [place] AT [place] [address] [place] [address] Dept: [phone] Loc: [phone]  Leah Sawyer  01/30/2024  You are scheduled for a Cardiac Catheterization on Friday, February 6 with Dr. Lonni End.  1. Please arrive at the [facility]ascular Center Entrance of [facility], [address], [place] [address] at 7:30 AM (This is 1 hour(s) prior to your procedure time).  Proceed to the Check-In Desk directly inside the entrance.  Procedure Parking: Use the entrance off of the [address] side of the hospital. Turn right upon entering and follow the driveway to parking that is directly in front of the Heart & Vascular Center. There is no valet parking available at this entrance, however there is an awning directly in front of the Heart & Vascular Center for drop off/ pick up for patients.  Special note: Every effort is made to have your procedure done on time. Please understand that emergencies sometimes delay scheduled procedures.  2. Diet: Nothing to eat after midnight.   3. Hydration: You need to be well hydrated before your procedure. On February 6, you may drink approved liquids (see below) until 2 hours before the procedure, with 16 oz of water as your last intake.   List of approved liquids water, clear juice, clear tea, black coffee, fruit juices, non-citric and without pulp, carbonated beverages, Gatorade, Kool -Aid, plain Jello-O and plain ice popsicles.   On the morning of your procedure, take your Aspirin 81 mg and any morning medicines NOT listed above.  You may use sips of water.  - HOLD spironolactone  day of procedure   6. Plan to go home the same day, you will only stay overnight if medically  necessary. 7. Bring a current list of your medications and current insurance cards. 8. You MUST have a responsible person to drive you home. 9. Someone MUST be with you the first 24 hours after you arrive home or your discharge will be delayed. 10. Please wear clothes that are easy to get on and off and wear slip-on shoes.  Thank you for allowing us  to care for you!   -- [place] Invasive Cardiovascular services   Follow-Up: At [facility], you and your health needs are our priority.  As part of our continuing mission to provide you with exceptional heart care, our providers are all part of one team.  This team includes your primary Cardiologist (physician) and Advanced Practice Providers or APPs (Physician Assistants and Nurse Practitioners) who all work together to provide you with the care you need, when you need it.  Your next appointment:   6 week(s)  Provider:   You may see Dr Darliss or one of the following Advanced Practice Providers on your designated Care Team:   Lonni Meager, NP Lesley Maffucci, PA-C Bernardino Bring, PA-C Cadence [place], PA-C Tylene Lunch, NP Barnie Hila, NP    We recommend signing up for the patient portal called MyChart.  Sign up information is provided on this After Visit Summary.  MyChart is used to connect with patients for Virtual Visits (Telemedicine).  Patients are able to view lab/test results, encounter notes, upcoming appointments, etc.  Non-urgent messages can be sent to your provider as well.   To learn more about what you can do with MyChart, go to [email or web address].              Signed, Redell Darliss, MD  01/30/2024 12:38 PM    [facility] "

## 2024-01-30 NOTE — Progress Notes (Addendum)
 " Cardiology Office Note:    Date:  01/30/2024   ID:  Leah Sawyer, DOB 1955/11/04, MRN [id number]  PCP:  Sampson Ethridge LABOR, MD   [facility] Providers Cardiologist:  None     Referring MD: Sampson Ethridge LABOR, MD   Chief Complaint  Patient presents with   Follow-up    3 month follow up  / pt has been doing well with no complaints of chest pain, chest pressure or SOB, medication reviewed verbally with patient .     History of Present Illness:    Leah Sawyer is a 69 y.o. female with a hx of HFrEF, hypertension, diabetes, OSA presents due to systolic heart failure.  Last seen due to OSA, and history of CHF.  Echocardiogram was obtained showing moderately reduced ejection fraction EF 35 to 40%.  Medication was switched, started on Entresto  and carvedilol .  Patient tolerating both meds with no issues.  Denies chest pain, denies edema.  States her mother has a history of cardiomyopathy.  Also endorses daytime fatigue and somnolence.  Was referred and followed up with sleep medicine. CPAP mask prescribed/calibrated.  Echo 12/25 EF 35 to 40%. Patient had an echo 05/2022 with EF ?40 to 50%.    Past Medical History:  Diagnosis Date   Arrhythmia    Diabetes mellitus without complication (HCC) 2003   Glaucoma    Hypertension 2003   Osteoarthritis    Vitamin D deficiency 11/18/2023    Past Surgical History:  Procedure Laterality Date   ABDOMINAL HYSTERECTOMY     COLONOSCOPY N/A 04/25/2023   Procedure: COLONOSCOPY;  Surgeon: Jinny Carmine, MD;  Location: [facility] ENDOSCOPY;  Service: Endoscopy;  Laterality: N/A;  PATIENT REQUESTED AS CLOSE TO 11:00 AM AS POSSIBLE   TONSILLECTOMY      Current Medications: Current Meds  Medication Sig   ACCU-CHEK GUIDE TEST test strip USE TO CHECK FASTING BLOOD SUGAR ONCE DAILY AS DIRECTED AS NEEDED   Accu-Chek Softclix Lancets lancets daily as needed.   Blood Glucose Monitoring Suppl (ACCU-CHEK GUIDE ME) w/Device KIT USE AS DIRECTED  TO CHECK BLOOD SUGAR FOR DIABETES   carvedilol  (COREG ) 6.25 MG tablet Take 1 tablet (6.25 mg total) by mouth 2 (two) times daily.   dorzolamide-timolol (COSOPT) 2-0.5 % ophthalmic solution 1 drop 2 (two) times daily.   Krill Oil 1000 MG CAPS as directed Orally once a day   meloxicam (MOBIC) 15 MG tablet Take 15 mg by mouth daily.   sacubitril -valsartan  (ENTRESTO ) 49-51 MG Take 1 tablet by mouth 2 (two) times daily.   spironolactone  (ALDACTONE ) 25 MG tablet Take 0.5 tablets (12.5 mg total) by mouth daily.   Travoprost, BAK Free, (TRAVATAN) 0.004 % SOLN ophthalmic solution 1 drop at bedtime.     Allergies:   Patient has no known allergies.   Social History   Socioeconomic History   Marital status: Married    Spouse name: Not on file   Number of children: Not on file   Years of education: Not on file   Highest education level: Not on file  Occupational History   Not on file  Tobacco Use   Smoking status: Never   Smokeless tobacco: Never  Vaping Use   Vaping status: Not on file  Substance and Sexual Activity   Alcohol use: No   Drug use: No   Sexual activity: Yes  Other Topics Concern   Not on file  Social History Narrative   Not on file   Social Drivers  of Health   Tobacco Use: Low Risk (01/30/2024)   Patient History    Smoking Tobacco Use: Never    Smokeless Tobacco Use: Never    Passive Exposure: Not on file  Financial Resource Strain: Low Risk  (12/17/2022)   Received from [facility]   Overall Financial Resource Strain (CARDIA)    Difficulty of Paying Living Expenses: Not very hard  Food Insecurity: No Food Insecurity (04/27/2023)   Hunger Vital Sign    Worried About Running Out of Food in the Last Year: Never true    Ran Out of Food in the Last Year: Never true  Transportation Needs: No Transportation Needs (04/27/2023)   PRAPARE - Administrator, Civil Service (Medical): No    Lack of Transportation (Non-Medical): No  Physical  Activity: Not on file  Stress: Not on file  Social Connections: Not on file  Depression (EYV7-0): Not on file  Alcohol Screen: Not on file  Housing: Unknown (04/27/2023)   Housing Stability Vital Sign    Unable to Pay for Housing in the Last Year: No    Number of Times Moved in the Last Year: Not on file    Homeless in the Last Year: No  Utilities: Not At Risk (04/27/2023)   AHC Utilities    Threatened with loss of utilities: No  Health Literacy: Not on file     Family History: The patient's family history includes Heart disease in her mother.  ROS:   Please see the history of present illness.     All other systems reviewed and are negative.  EKGs/Labs/Other Studies Reviewed:    The following studies were reviewed today:       Recent Labs: No results found for requested labs within last 365 days.  Recent Lipid Panel No results found for: CHOL, TRIG, HDL, CHOLHDL, VLDL, LDLCALC, LDLDIRECT   Risk Assessment/Calculations:             Physical Exam:    VS:  BP 118/78 (BP Location: Left Arm, Patient Position: Sitting, Cuff Size: Normal)   Pulse 65   Ht 5' 3 (1.6 m)   Wt 213 lb 9.6 oz (96.9 kg)   SpO2 97%   BMI 37.84 kg/m     Wt Readings from Last 3 Encounters:  01/30/24 213 lb 9.6 oz (96.9 kg)  11/18/23 206 lb 6.4 oz (93.6 kg)  10/30/23 204 lb 14.4 oz (92.9 kg)     GEN:  Well nourished, well developed in no acute distress HEENT: Normal NECK: No JVD; No carotid bruits CARDIAC: RRR, no murmurs, rubs, gallops RESPIRATORY:  Clear to auscultation without rales, wheezing or rhonchi  ABDOMEN: Soft, non-tender, non-distended MUSCULOSKELETAL:  No edema; No deformity  SKIN: Warm and dry NEUROLOGIC:  Alert and oriented x 3 PSYCHIATRIC:  Normal affect   ASSESSMENT:    1. Systolic heart failure, unspecified HF chronicity (HCC)   2. Primary hypertension   3. OSA (obstructive sleep apnea)    PLAN:    In order of problems listed  above:  Cardiomyopathy, echo 12/25 shows moderately reduced EF 35 to 40%.  Etiology for cardiomyopathy could be hereditary with mother having history of cardiomyopathy.  Continue Coreg  6.25 mg twice daily, Entresto  49-51 mg twice daily started.  Schedule right and left heart cath.  Start Aldactone  12.5 mg daily.   Hypertension, BP controlled.  Coreg  6.25 mg twice daily, Entresto  49-51 mg twice daily, Aldactone  12.5 mg daily as above. OSA, noncompliant with  CPAP, fatigue, somnolence.  Referred to sleep specialist for CPAP titration/update.  Follow-up in 6 weeks after cardiac cath.  Informed Consent   Shared Decision Making/Informed Consent The risks [stroke (1 in 1000), death (1 in 1000), kidney failure [usually temporary] (1 in 500), bleeding (1 in 200), allergic reaction [possibly serious] (1 in 200)], benefits (diagnostic support and management of coronary artery disease) and alternatives of a cardiac catheterization were discussed in detail with Ms. Stills and she is willing to proceed.          Medication Adjustments/Labs and Tests Ordered: Current medicines are reviewed at length with the patient today.  Concerns regarding medicines are outlined above.  Orders Placed This Encounter  Procedures   CBC   Basic metabolic panel with GFR   EKG 87-Ozji   Meds ordered this encounter  Medications   spironolactone  (ALDACTONE ) 25 MG tablet    Sig: Take 0.5 tablets (12.5 mg total) by mouth daily.    Dispense:  90 tablet    Refill:  3    Patient Instructions  Medication Instructions:  - START spironolactone  12.5 mg daily   *If you need a refill on your cardiac medications before your next appointment, please call your pharmacy*  Lab Work: Your provider would like for you to have following labs drawn today CBC, BMP.   If you have labs (blood work) drawn today and your tests are completely normal, you will receive your results only by: MyChart Message (if you have MyChart) OR A paper  copy in the mail If you have any lab test that is abnormal or we need to change your treatment, we will call you to review the results.  Testing/Procedures:  [facility] [place] [facility] [place] [address] [place] [address] Dept: [phone] Loc: [phone]  ZANIYAH WERNETTE  01/30/2024  You are scheduled for a Cardiac Catheterization on Friday, February 6 with Dr. Lonni End.  1. Please arrive at the [facility] Entrance of [facility], [address], [place] [address] at 7:30 AM (This is 1 hour(s) prior to your procedure time).  Proceed to the Check-In Desk directly inside the entrance.  Procedure Parking: Use the entrance off of the [address] side of the hospital. Turn right upon entering and follow the driveway to parking that is directly in front of the Heart [facility] Vascular Center. There is no valet parking available at this entrance, however there is an awning directly in front of the Heart & Vascular Center for drop off/ pick up for patients.  Special note: Every effort is made to have your procedure done on time. Please understand that emergencies sometimes delay scheduled procedures.  2. Diet: Nothing to eat after midnight.   3. Hydration: You need to be well hydrated before your procedure. On February 6, you may drink approved liquids (see below) until 2 hours before the procedure, with 16 oz of water as your last intake.   List of approved liquids water, clear juice, clear tea, black coffee, fruit juices, non-citric and without pulp, carbonated beverages, Gatorade, Kool -Aid, plain Jello-O and plain ice popsicles.   On the morning of your procedure, take your Aspirin 81 mg and any morning medicines NOT listed above.  You may use sips of water.  - HOLD spironolactone  day of procedure   6. Plan to go home the same day, you will only stay overnight if medically  necessary. 7. Bring a current list of your medications and current insurance cards. 8. You MUST have a responsible person to drive you home. 9. Someone MUST be with you the first 24 hours after you arrive home or your discharge will be delayed. 10. Please wear clothes that are easy to get on and off and wear slip-on shoes.  Thank you for allowing us  to care for you!   -- [facility]   Follow-Up: At [facility], you and your health needs are our priority.  As part of our continuing mission to provide you with exceptional heart care, our providers are all part of one team.  This team includes your primary Cardiologist (physician) and Advanced Practice Providers or APPs (Physician Assistants and Nurse Practitioners) who all work together to provide you with the care you need, when you need it.  Your next appointment:   6 week(s)  Provider:   You may see Dr Darliss or one of the following Advanced Practice Providers on your designated Care Team:   Lonni Meager, NP Lesley Maffucci, PA-C Bernardino Bring, PA-C Cadence Etowah, PA-C Tylene Lunch, NP Barnie Hila, NP    We recommend signing up for the patient portal called MyChart.  Sign up information is provided on this After Visit Summary.  MyChart is used to connect with patients for Virtual Visits (Telemedicine).  Patients are able to view lab/test results, encounter notes, upcoming appointments, etc.  Non-urgent messages can be sent to your provider as well.   To learn more about what you can do with MyChart, go to [email or web address].              Signed, Redell Darliss, MD  01/30/2024 12:38 PM    [facility] "

## 2024-01-31 LAB — BASIC METABOLIC PANEL WITH GFR
BUN/Creatinine Ratio: 16 (ref 12–28)
BUN: 18 mg/dL (ref 8–27)
CO2: 23 mmol/L (ref 20–29)
Calcium: 9.6 mg/dL (ref 8.7–10.3)
Chloride: 101 mmol/L (ref 96–106)
Creatinine, Ser: 1.11 mg/dL — ABNORMAL HIGH (ref 0.57–1.00)
Glucose: 167 mg/dL — ABNORMAL HIGH (ref 70–99)
Potassium: 4.5 mmol/L (ref 3.5–5.2)
Sodium: 139 mmol/L (ref 134–144)
eGFR: 55 mL/min/1.73 — ABNORMAL LOW

## 2024-01-31 LAB — CBC
Hematocrit: 42.2 % (ref 34.0–46.6)
Hemoglobin: 13.6 g/dL (ref 11.1–15.9)
MCH: 27.9 pg (ref 26.6–33.0)
MCHC: 32.2 g/dL (ref 31.5–35.7)
MCV: 87 fL (ref 79–97)
Platelets: 274 x10E3/uL (ref 150–450)
RBC: 4.88 x10E6/uL (ref 3.77–5.28)
RDW: 12.9 % (ref 11.7–15.4)
WBC: 7.5 x10E3/uL (ref 3.4–10.8)

## 2024-02-20 ENCOUNTER — Telehealth: Payer: Self-pay | Admitting: *Deleted

## 2024-02-20 NOTE — Telephone Encounter (Addendum)
 Cardiac Catheterization scheduled at [facility] for: Friday February 21, 2024 8:30 AM Arrival time [facility] Center Entrance at: 7:30 AM [address] [place] [address]  Diet: -Nothing to eat after midnight.  Hydration: -May drink clear liquids until 2 hours (6:30 AM) before the procedure.  Approved liquids: Water , clear tea, black coffee, fruit juices-non-citric and without pulp,Gatorade, plain Jello/popsicles.   -Please drink 8 oz of water  2 hours before procedure.  Medication instructions: -Hold:  Entresto /Mobic/Spironolactone -day before and day of procedure-per protocol GFR < 60 (55)  Already taken AM dose today-will hold AM of procedure and will also hold Entresto  PM dose prior to procedure  -Other usual morning medications can be taken including aspirin 81 mg.  Plan to go home the same day, you will only stay overnight if medically necessary.  You must have responsible adult to drive you home.  Someone must be with you the first 24 hours after you arrive home.  Reviewed procedure instructions with patient.

## 2024-02-21 ENCOUNTER — Ambulatory Visit
Admission: RE | Admit: 2024-02-21 | Discharge: 2024-02-21 | Disposition: A | Source: Home / Self Care | Attending: Internal Medicine | Admitting: Internal Medicine

## 2024-02-21 ENCOUNTER — Other Ambulatory Visit: Payer: Self-pay

## 2024-02-21 ENCOUNTER — Encounter: Admission: RE | Disposition: A | Payer: Self-pay | Source: Home / Self Care | Attending: Internal Medicine

## 2024-02-21 DIAGNOSIS — I5020 Unspecified systolic (congestive) heart failure: Secondary | ICD-10-CM

## 2024-02-21 DIAGNOSIS — I5022 Chronic systolic (congestive) heart failure: Secondary | ICD-10-CM

## 2024-02-21 LAB — POCT I-STAT EG7
Acid-Base Excess: 1 mmol/L (ref 0.0–2.0)
Bicarbonate: 27 mmol/L (ref 20.0–28.0)
Calcium, Ion: 1.17 mmol/L (ref 1.15–1.40)
HCT: 39 % (ref 36.0–46.0)
Hemoglobin: 13.3 g/dL (ref 12.0–15.0)
O2 Saturation: 70 %
Potassium: 3.7 mmol/L (ref 3.5–5.1)
Sodium: 141 mmol/L (ref 135–145)
TCO2: 28 mmol/L (ref 22–32)
pCO2, Ven: 47.6 mmHg (ref 44–60)
pH, Ven: 7.362 (ref 7.25–7.43)
pO2, Ven: 39 mmHg (ref 32–45)

## 2024-02-21 LAB — POCT I-STAT 7, (LYTES, BLD GAS, ICA,H+H)
Acid-Base Excess: 1 mmol/L (ref 0.0–2.0)
Bicarbonate: 25.7 mmol/L (ref 20.0–28.0)
Calcium, Ion: 1.23 mmol/L (ref 1.15–1.40)
HCT: 39 % (ref 36.0–46.0)
Hemoglobin: 13.3 g/dL (ref 12.0–15.0)
O2 Saturation: 96 %
Potassium: 3.9 mmol/L (ref 3.5–5.1)
Sodium: 139 mmol/L (ref 135–145)
TCO2: 27 mmol/L (ref 22–32)
pCO2 arterial: 41.1 mmHg (ref 32–48)
pH, Arterial: 7.404 (ref 7.35–7.45)
pO2, Arterial: 82 mmHg — ABNORMAL LOW (ref 83–108)

## 2024-02-21 LAB — GLUCOSE, CAPILLARY: Glucose-Capillary: 129 mg/dL — ABNORMAL HIGH (ref 70–99)

## 2024-02-21 MED ORDER — SODIUM CHLORIDE 0.9 % IV SOLN: INTRAVENOUS | Status: DC

## 2024-02-21 MED ORDER — FENTANYL CITRATE (PF) 100 MCG/2ML IJ SOLN
INTRAMUSCULAR | Status: AC
  Filled 2024-02-21: qty 2

## 2024-02-21 MED ORDER — MIDAZOLAM HCL (PF) 2 MG/2ML IJ SOLN
INTRAMUSCULAR | Status: DC | PRN
  Administered 2024-02-21 (×2): 1 mg via INTRAVENOUS

## 2024-02-21 MED ORDER — FENTANYL CITRATE (PF) 100 MCG/2ML IJ SOLN
INTRAMUSCULAR | Status: DC | PRN
  Administered 2024-02-21 (×2): 25 ug via INTRAVENOUS

## 2024-02-21 MED ORDER — ACETAMINOPHEN 325 MG PO TABS
650.0000 mg | ORAL_TABLET | ORAL | Status: DC | PRN
  Administered 2024-02-21: 650 mg via ORAL

## 2024-02-21 MED ORDER — MIDAZOLAM HCL 2 MG/2ML IJ SOLN
INTRAMUSCULAR | Status: AC
  Filled 2024-02-21: qty 2

## 2024-02-21 MED ORDER — SODIUM CHLORIDE 0.9% FLUSH: 3.0000 mL | INTRAVENOUS | Status: DC | PRN

## 2024-02-21 MED ORDER — SODIUM CHLORIDE 0.9 % IV SOLN: 250.0000 mL | INTRAVENOUS | Status: DC | PRN

## 2024-02-21 MED ORDER — ONDANSETRON HCL 4 MG/2ML IJ SOLN: 4.0000 mg | Freq: Four times a day (QID) | INTRAMUSCULAR | Status: DC | PRN

## 2024-02-21 MED ORDER — VERAPAMIL HCL 2.5 MG/ML IV SOLN
INTRAVENOUS | Status: DC | PRN
  Administered 2024-02-21 (×2): 2.5 mg via INTRAVENOUS

## 2024-02-21 MED ORDER — ACETAMINOPHEN 325 MG PO TABS
ORAL_TABLET | ORAL | Status: AC
  Filled 2024-02-21: qty 2

## 2024-02-21 MED ORDER — HEPARIN (PORCINE) IN NACL 1000-0.9 UT/500ML-% IV SOLN
INTRAVENOUS | Status: AC
  Filled 2024-02-21: qty 1000

## 2024-02-21 MED ORDER — LIDOCAINE HCL (PF) 1 % IJ SOLN
INTRAMUSCULAR | Status: DC | PRN
  Administered 2024-02-21 (×2): 10 mL via SUBCUTANEOUS

## 2024-02-21 MED ORDER — FREE WATER: 250.0000 mL | Freq: Once | Status: DC

## 2024-02-21 MED ORDER — LIDOCAINE HCL 1 % IJ SOLN
INTRAMUSCULAR | Status: AC
  Filled 2024-02-21: qty 20

## 2024-02-21 MED ORDER — IOHEXOL 300 MG/ML  SOLN: INTRAMUSCULAR | Status: DC | PRN

## 2024-02-21 MED ORDER — HYDRALAZINE HCL 20 MG/ML IJ SOLN: 10.0000 mg | INTRAMUSCULAR | Status: DC | PRN

## 2024-02-21 MED ORDER — HEPARIN SODIUM (PORCINE) 1000 UNIT/ML IJ SOLN
INTRAMUSCULAR | Status: DC | PRN
  Administered 2024-02-21: 5000 [IU] via INTRAVENOUS

## 2024-02-21 MED ORDER — HEPARIN SODIUM (PORCINE) 1000 UNIT/ML IJ SOLN
INTRAMUSCULAR | Status: AC
  Filled 2024-02-21: qty 10

## 2024-02-21 MED ORDER — HEPARIN (PORCINE) IN NACL 2000-0.9 UNIT/L-% IV SOLN
INTRAVENOUS | Status: DC | PRN
  Administered 2024-02-21: 1000 mL

## 2024-02-21 MED ORDER — SODIUM CHLORIDE 0.9% FLUSH: 3.0000 mL | Freq: Two times a day (BID) | INTRAVENOUS | Status: DC

## 2024-02-21 MED ORDER — LABETALOL HCL 5 MG/ML IV SOLN: 10.0000 mg | INTRAVENOUS | Status: DC | PRN

## 2024-02-21 MED ORDER — VERAPAMIL HCL 2.5 MG/ML IV SOLN
INTRAVENOUS | Status: AC
  Filled 2024-02-21: qty 2

## 2024-02-21 NOTE — Interval H&P Note (Signed)
 History and Physical Interval Note:  02/21/2024 8:39 AM  Leah Sawyer  has presented today for surgery, with the diagnosis of chronic HFrEF.  The various methods of treatment have been discussed with the patient and family. After consideration of risks, benefits and other options for treatment, the patient has consented to  Procedures: RIGHT/LEFT HEART CATH AND CORONARY ANGIOGRAPHY (Bilateral) as a surgical intervention.  The patient's history has been reviewed, patient examined, no change in status, stable for surgery.  I have reviewed the patient's chart and labs.  Questions were answered to the patient's satisfaction.    Cath Lab Visit (complete for each Cath Lab visit)  Clinical Evaluation Leading to the Procedure:   ACS: No.  Non-ACS:    Anginal/Heart Failure Classification: NYHA class II  Anti-ischemic medical therapy: Minimal Therapy (1 class of medications)  Non-Invasive Test Results: No non-invasive testing performed; LVEF 35-40% by echo -> intermediate risk  Prior CABG: No previous CABG  Leah Sawyer

## 2024-04-02 ENCOUNTER — Ambulatory Visit: Admitting: Cardiology
